# Patient Record
Sex: Female | Born: 1940 | Race: White | Hispanic: No | Marital: Married | Smoking: Former smoker
Health system: Southern US, Community
[De-identification: ages and names within clinical notes are randomized; demographics above are authoritative.]

## PROBLEM LIST (undated history)

## (undated) DIAGNOSIS — K52832 Lymphocytic colitis: Secondary | ICD-10-CM

## (undated) DIAGNOSIS — R112 Nausea with vomiting, unspecified: Secondary | ICD-10-CM

## (undated) DIAGNOSIS — K2960 Other gastritis without bleeding: Secondary | ICD-10-CM

## (undated) DIAGNOSIS — K5790 Diverticulosis of intestine, part unspecified, without perforation or abscess without bleeding: Secondary | ICD-10-CM

## (undated) DIAGNOSIS — K589 Irritable bowel syndrome without diarrhea: Secondary | ICD-10-CM

## (undated) DIAGNOSIS — K219 Gastro-esophageal reflux disease without esophagitis: Secondary | ICD-10-CM

## (undated) DIAGNOSIS — Z9889 Other specified postprocedural states: Secondary | ICD-10-CM

## (undated) DIAGNOSIS — G43909 Migraine, unspecified, not intractable, without status migrainosus: Secondary | ICD-10-CM

## (undated) DIAGNOSIS — N8110 Cystocele, unspecified: Secondary | ICD-10-CM

## (undated) HISTORY — DX: Irritable bowel syndrome without diarrhea: K58.9

## (undated) HISTORY — PX: VAGINAL HYSTERECTOMY: SUR661

## (undated) HISTORY — DX: Diverticulosis of intestine, part unspecified, without perforation or abscess without bleeding: K57.90

## (undated) HISTORY — DX: Migraine, unspecified, not intractable, without status migrainosus: G43.909

## (undated) HISTORY — DX: Gastro-esophageal reflux disease without esophagitis: K21.9

## (undated) HISTORY — DX: Other gastritis without bleeding: K29.60

## (undated) HISTORY — DX: Cystocele, unspecified: N81.10

## (undated) HISTORY — DX: Lymphocytic colitis: K52.832

## (undated) HISTORY — PX: BREAST SURGERY: SHX581

## (undated) HISTORY — PX: CATARACT EXTRACTION: SUR2

---

## 1998-01-28 ENCOUNTER — Ambulatory Visit (HOSPITAL_COMMUNITY): Admission: RE | Admit: 1998-01-28 | Discharge: 1998-01-28 | Payer: Self-pay | Admitting: Obstetrics and Gynecology

## 2000-04-06 ENCOUNTER — Ambulatory Visit (HOSPITAL_COMMUNITY): Admission: RE | Admit: 2000-04-06 | Discharge: 2000-04-06 | Payer: Self-pay | Admitting: Obstetrics and Gynecology

## 2000-04-06 ENCOUNTER — Encounter: Payer: Self-pay | Admitting: Obstetrics and Gynecology

## 2001-04-08 ENCOUNTER — Encounter: Payer: Self-pay | Admitting: Obstetrics and Gynecology

## 2001-04-08 ENCOUNTER — Ambulatory Visit (HOSPITAL_COMMUNITY): Admission: RE | Admit: 2001-04-08 | Discharge: 2001-04-08 | Payer: Self-pay | Admitting: Obstetrics and Gynecology

## 2002-06-14 ENCOUNTER — Ambulatory Visit (HOSPITAL_COMMUNITY): Admission: RE | Admit: 2002-06-14 | Discharge: 2002-06-14 | Payer: Self-pay | Admitting: Obstetrics and Gynecology

## 2002-06-14 ENCOUNTER — Encounter: Payer: Self-pay | Admitting: Obstetrics and Gynecology

## 2004-08-19 ENCOUNTER — Ambulatory Visit (HOSPITAL_COMMUNITY): Admission: RE | Admit: 2004-08-19 | Discharge: 2004-08-19 | Payer: Self-pay | Admitting: Family Medicine

## 2005-02-04 ENCOUNTER — Ambulatory Visit: Payer: Self-pay | Admitting: Family Medicine

## 2005-02-10 ENCOUNTER — Ambulatory Visit: Payer: Self-pay | Admitting: Internal Medicine

## 2005-08-17 ENCOUNTER — Ambulatory Visit (HOSPITAL_COMMUNITY): Admission: RE | Admit: 2005-08-17 | Discharge: 2005-08-17 | Payer: Self-pay | Admitting: Obstetrics and Gynecology

## 2005-08-19 ENCOUNTER — Ambulatory Visit: Payer: Self-pay | Admitting: Family Medicine

## 2005-09-10 ENCOUNTER — Ambulatory Visit: Payer: Self-pay | Admitting: Family Medicine

## 2005-09-22 ENCOUNTER — Ambulatory Visit: Payer: Self-pay | Admitting: Family Medicine

## 2006-01-26 ENCOUNTER — Ambulatory Visit: Payer: Self-pay | Admitting: Family Medicine

## 2006-08-19 ENCOUNTER — Ambulatory Visit: Payer: Self-pay | Admitting: Family Medicine

## 2006-08-19 ENCOUNTER — Ambulatory Visit (HOSPITAL_COMMUNITY): Admission: RE | Admit: 2006-08-19 | Discharge: 2006-08-19 | Payer: Self-pay | Admitting: Obstetrics and Gynecology

## 2006-10-12 HISTORY — PX: SHOULDER SURGERY: SHX246

## 2007-08-22 ENCOUNTER — Ambulatory Visit (HOSPITAL_COMMUNITY): Admission: RE | Admit: 2007-08-22 | Discharge: 2007-08-22 | Payer: Self-pay | Admitting: Obstetrics and Gynecology

## 2007-08-26 ENCOUNTER — Encounter: Admission: RE | Admit: 2007-08-26 | Discharge: 2007-08-26 | Payer: Self-pay | Admitting: Obstetrics and Gynecology

## 2007-09-01 ENCOUNTER — Ambulatory Visit: Payer: Self-pay | Admitting: Family Medicine

## 2007-09-01 DIAGNOSIS — J189 Pneumonia, unspecified organism: Secondary | ICD-10-CM

## 2007-09-05 ENCOUNTER — Encounter: Admission: RE | Admit: 2007-09-05 | Discharge: 2007-09-05 | Payer: Self-pay | Admitting: Obstetrics and Gynecology

## 2007-09-15 ENCOUNTER — Ambulatory Visit: Payer: Self-pay | Admitting: Internal Medicine

## 2007-09-15 DIAGNOSIS — M810 Age-related osteoporosis without current pathological fracture: Secondary | ICD-10-CM | POA: Insufficient documentation

## 2007-09-15 DIAGNOSIS — R32 Unspecified urinary incontinence: Secondary | ICD-10-CM | POA: Insufficient documentation

## 2007-09-22 ENCOUNTER — Ambulatory Visit: Payer: Self-pay | Admitting: Family Medicine

## 2007-09-27 ENCOUNTER — Telehealth (INDEPENDENT_AMBULATORY_CARE_PROVIDER_SITE_OTHER): Payer: Self-pay | Admitting: *Deleted

## 2007-10-13 HISTORY — PX: ESOPHAGOGASTRODUODENOSCOPY (EGD) WITH ESOPHAGEAL DILATION: SHX5812

## 2007-10-13 HISTORY — PX: COLONOSCOPY W/ BIOPSIES: SHX1374

## 2008-03-12 ENCOUNTER — Ambulatory Visit: Payer: Self-pay | Admitting: Family Medicine

## 2008-03-12 DIAGNOSIS — G43009 Migraine without aura, not intractable, without status migrainosus: Secondary | ICD-10-CM | POA: Insufficient documentation

## 2008-03-12 DIAGNOSIS — K589 Irritable bowel syndrome without diarrhea: Secondary | ICD-10-CM

## 2008-04-11 ENCOUNTER — Ambulatory Visit: Payer: Self-pay | Admitting: Internal Medicine

## 2008-04-11 LAB — CONVERTED CEMR LAB
Basophils Relative: 0.4 % (ref 0.0–1.0)
Calcium: 9.3 mg/dL (ref 8.4–10.5)
Eosinophils Absolute: 0.3 10*3/uL (ref 0.0–0.7)
GFR calc Af Amer: 80 mL/min
Glucose, Bld: 84 mg/dL (ref 70–99)
IgA: 219 mg/dL (ref 68–378)
MCHC: 34.2 g/dL (ref 30.0–36.0)
Monocytes Absolute: 0.5 10*3/uL (ref 0.1–1.0)
Monocytes Relative: 5.8 % (ref 3.0–12.0)
Neutro Abs: 6.7 10*3/uL (ref 1.4–7.7)
Neutrophils Relative %: 71.7 % (ref 43.0–77.0)
Platelets: 318 10*3/uL (ref 150–400)
RBC: 4.29 M/uL (ref 3.87–5.11)
Sodium: 140 meq/L (ref 135–145)

## 2008-04-16 LAB — CONVERTED CEMR LAB: Tissue Transglutaminase Ab, IgA: 0.3 units (ref <7)

## 2008-04-17 ENCOUNTER — Ambulatory Visit: Payer: Self-pay | Admitting: Internal Medicine

## 2008-04-17 ENCOUNTER — Encounter (INDEPENDENT_AMBULATORY_CARE_PROVIDER_SITE_OTHER): Payer: Self-pay | Admitting: *Deleted

## 2008-04-17 ENCOUNTER — Encounter: Payer: Self-pay | Admitting: Internal Medicine

## 2008-04-20 ENCOUNTER — Telehealth (INDEPENDENT_AMBULATORY_CARE_PROVIDER_SITE_OTHER): Payer: Self-pay

## 2008-05-11 ENCOUNTER — Encounter (INDEPENDENT_AMBULATORY_CARE_PROVIDER_SITE_OTHER): Payer: Self-pay | Admitting: Internal Medicine

## 2008-06-04 ENCOUNTER — Ambulatory Visit: Payer: Self-pay | Admitting: Internal Medicine

## 2008-06-04 DIAGNOSIS — K5289 Other specified noninfective gastroenteritis and colitis: Secondary | ICD-10-CM

## 2008-06-04 DIAGNOSIS — K219 Gastro-esophageal reflux disease without esophagitis: Secondary | ICD-10-CM | POA: Insufficient documentation

## 2008-06-04 DIAGNOSIS — K222 Esophageal obstruction: Secondary | ICD-10-CM | POA: Insufficient documentation

## 2008-07-05 ENCOUNTER — Ambulatory Visit: Payer: Self-pay | Admitting: Family Medicine

## 2008-07-05 DIAGNOSIS — IMO0002 Reserved for concepts with insufficient information to code with codable children: Secondary | ICD-10-CM

## 2008-07-05 DIAGNOSIS — M76829 Posterior tibial tendinitis, unspecified leg: Secondary | ICD-10-CM | POA: Insufficient documentation

## 2008-07-16 ENCOUNTER — Ambulatory Visit: Payer: Self-pay | Admitting: Family Medicine

## 2008-09-03 ENCOUNTER — Encounter (INDEPENDENT_AMBULATORY_CARE_PROVIDER_SITE_OTHER): Payer: Self-pay | Admitting: Internal Medicine

## 2008-09-04 ENCOUNTER — Ambulatory Visit (HOSPITAL_COMMUNITY): Admission: RE | Admit: 2008-09-04 | Discharge: 2008-09-04 | Payer: Self-pay | Admitting: Family Medicine

## 2008-09-05 ENCOUNTER — Encounter (INDEPENDENT_AMBULATORY_CARE_PROVIDER_SITE_OTHER): Payer: Self-pay | Admitting: *Deleted

## 2008-09-11 ENCOUNTER — Ambulatory Visit: Payer: Self-pay | Admitting: Family Medicine

## 2008-09-11 ENCOUNTER — Other Ambulatory Visit: Admission: RE | Admit: 2008-09-11 | Discharge: 2008-09-11 | Payer: Self-pay | Admitting: Family Medicine

## 2008-09-11 ENCOUNTER — Encounter (INDEPENDENT_AMBULATORY_CARE_PROVIDER_SITE_OTHER): Payer: Self-pay | Admitting: Internal Medicine

## 2008-09-11 DIAGNOSIS — E785 Hyperlipidemia, unspecified: Secondary | ICD-10-CM

## 2008-09-13 ENCOUNTER — Encounter (INDEPENDENT_AMBULATORY_CARE_PROVIDER_SITE_OTHER): Payer: Self-pay | Admitting: *Deleted

## 2008-09-13 LAB — CONVERTED CEMR LAB
BUN: 10 mg/dL (ref 6–23)
Basophils Relative: 0.9 % (ref 0.0–3.0)
Chloride: 105 meq/L (ref 96–112)
Cholesterol: 244 mg/dL (ref 0–200)
Eosinophils Absolute: 0.4 10*3/uL (ref 0.0–0.7)
GFR calc Af Amer: 80 mL/min
GFR calc non Af Amer: 66 mL/min
HCT: 40.1 % (ref 36.0–46.0)
Hemoglobin: 13.5 g/dL (ref 12.0–15.0)
Lymphocytes Relative: 26.5 % (ref 12.0–46.0)
MCV: 94.2 fL (ref 78.0–100.0)
Platelets: 286 10*3/uL (ref 150–400)
Potassium: 4.2 meq/L (ref 3.5–5.1)
RDW: 12.2 % (ref 11.5–14.6)
Sodium: 138 meq/L (ref 135–145)
Total CHOL/HDL Ratio: 3.6
WBC: 6.6 10*3/uL (ref 4.5–10.5)

## 2008-09-17 ENCOUNTER — Encounter (INDEPENDENT_AMBULATORY_CARE_PROVIDER_SITE_OTHER): Payer: Self-pay | Admitting: Internal Medicine

## 2008-10-01 ENCOUNTER — Telehealth: Payer: Self-pay | Admitting: Physician Assistant

## 2008-11-22 ENCOUNTER — Ambulatory Visit: Payer: Self-pay | Admitting: Family Medicine

## 2008-11-22 DIAGNOSIS — H6120 Impacted cerumen, unspecified ear: Secondary | ICD-10-CM

## 2008-12-31 ENCOUNTER — Ambulatory Visit: Payer: Self-pay | Admitting: Family Medicine

## 2008-12-31 DIAGNOSIS — S51809A Unspecified open wound of unspecified forearm, initial encounter: Secondary | ICD-10-CM | POA: Insufficient documentation

## 2009-01-02 ENCOUNTER — Ambulatory Visit: Payer: Self-pay | Admitting: Family Medicine

## 2009-06-27 ENCOUNTER — Telehealth: Payer: Self-pay | Admitting: Internal Medicine

## 2009-06-27 ENCOUNTER — Ambulatory Visit: Payer: Self-pay | Admitting: Gastroenterology

## 2009-07-26 ENCOUNTER — Ambulatory Visit: Payer: Self-pay | Admitting: Internal Medicine

## 2009-09-09 ENCOUNTER — Ambulatory Visit (HOSPITAL_COMMUNITY): Admission: RE | Admit: 2009-09-09 | Discharge: 2009-09-09 | Payer: Self-pay | Admitting: Family Medicine

## 2009-09-11 ENCOUNTER — Encounter (INDEPENDENT_AMBULATORY_CARE_PROVIDER_SITE_OTHER): Payer: Self-pay | Admitting: *Deleted

## 2009-09-13 ENCOUNTER — Telehealth (INDEPENDENT_AMBULATORY_CARE_PROVIDER_SITE_OTHER): Payer: Self-pay | Admitting: Internal Medicine

## 2009-09-30 ENCOUNTER — Telehealth (INDEPENDENT_AMBULATORY_CARE_PROVIDER_SITE_OTHER): Payer: Self-pay | Admitting: Internal Medicine

## 2009-10-02 ENCOUNTER — Ambulatory Visit: Payer: Self-pay | Admitting: Family Medicine

## 2009-10-02 DIAGNOSIS — R519 Headache, unspecified: Secondary | ICD-10-CM | POA: Insufficient documentation

## 2009-10-02 DIAGNOSIS — R51 Headache: Secondary | ICD-10-CM

## 2009-10-03 ENCOUNTER — Encounter (INDEPENDENT_AMBULATORY_CARE_PROVIDER_SITE_OTHER): Payer: Self-pay | Admitting: Internal Medicine

## 2009-10-03 ENCOUNTER — Ambulatory Visit: Payer: Self-pay | Admitting: Family Medicine

## 2009-10-08 ENCOUNTER — Telehealth (INDEPENDENT_AMBULATORY_CARE_PROVIDER_SITE_OTHER): Payer: Self-pay | Admitting: Internal Medicine

## 2009-10-08 LAB — CONVERTED CEMR LAB
ALT: 14 units/L (ref 0–35)
AST: 19 units/L (ref 0–37)
BUN: 10 mg/dL (ref 6–23)
Chloride: 106 meq/L (ref 96–112)
Direct LDL: 150.8 mg/dL
GFR calc non Af Amer: 65.7 mL/min (ref >60)
Glucose, Bld: 87 mg/dL (ref 70–99)
HDL: 73.8 mg/dL (ref >39.00)
TSH: 2.38 microintl units/mL (ref 0.35–5.50)
VLDL: 41 mg/dL — ABNORMAL HIGH (ref 0.0–40.0)

## 2009-10-16 ENCOUNTER — Encounter: Payer: Self-pay | Admitting: Family Medicine

## 2009-10-17 ENCOUNTER — Encounter: Payer: Self-pay | Admitting: Family Medicine

## 2009-10-17 ENCOUNTER — Ambulatory Visit: Payer: Self-pay | Admitting: Internal Medicine

## 2010-03-20 ENCOUNTER — Telehealth: Payer: Self-pay | Admitting: Internal Medicine

## 2010-03-27 ENCOUNTER — Ambulatory Visit: Payer: Self-pay | Admitting: Internal Medicine

## 2010-04-09 ENCOUNTER — Ambulatory Visit: Payer: Self-pay | Admitting: Family Medicine

## 2010-04-10 LAB — CONVERTED CEMR LAB
HDL: 69.2 mg/dL (ref >39.00)
VLDL: 47.2 mg/dL — ABNORMAL HIGH (ref 0.0–40.0)

## 2010-04-23 ENCOUNTER — Ambulatory Visit: Payer: Self-pay | Admitting: Family Medicine

## 2010-04-24 ENCOUNTER — Telehealth: Payer: Self-pay | Admitting: Internal Medicine

## 2010-04-30 ENCOUNTER — Ambulatory Visit: Payer: Self-pay | Admitting: Internal Medicine

## 2010-07-11 ENCOUNTER — Ambulatory Visit: Payer: Self-pay | Admitting: Family Medicine

## 2010-07-14 LAB — CONVERTED CEMR LAB
Cholesterol: 251 mg/dL — ABNORMAL HIGH (ref 0–200)
VLDL: 42 mg/dL — ABNORMAL HIGH (ref 0.0–40.0)

## 2010-07-15 ENCOUNTER — Ambulatory Visit: Payer: Self-pay | Admitting: Family Medicine

## 2010-07-17 ENCOUNTER — Ambulatory Visit: Payer: Self-pay | Admitting: Internal Medicine

## 2010-07-17 ENCOUNTER — Encounter (INDEPENDENT_AMBULATORY_CARE_PROVIDER_SITE_OTHER): Payer: Self-pay | Admitting: *Deleted

## 2010-09-11 ENCOUNTER — Ambulatory Visit (HOSPITAL_COMMUNITY)
Admission: RE | Admit: 2010-09-11 | Discharge: 2010-09-11 | Payer: Self-pay | Source: Home / Self Care | Admitting: Family Medicine

## 2010-11-05 ENCOUNTER — Telehealth: Payer: Self-pay | Admitting: Family Medicine

## 2010-11-06 ENCOUNTER — Telehealth: Payer: Self-pay | Admitting: Family Medicine

## 2010-11-11 ENCOUNTER — Ambulatory Visit
Admission: RE | Admit: 2010-11-11 | Discharge: 2010-11-11 | Payer: Self-pay | Source: Home / Self Care | Attending: Family Medicine | Admitting: Family Medicine

## 2010-11-11 ENCOUNTER — Other Ambulatory Visit: Payer: Self-pay | Admitting: Family Medicine

## 2010-11-11 LAB — LIPID PANEL
HDL: 74.9 mg/dL (ref >39.00)
Triglycerides: 173 mg/dL — ABNORMAL HIGH (ref 0.0–149.0)
VLDL: 34.6 mg/dL (ref 0.0–40.0)

## 2010-11-11 LAB — LDL CHOLESTEROL, DIRECT: Direct LDL: 159.1 mg/dL

## 2010-11-11 NOTE — Consult Note (Signed)
Summary: [facility]  [facility]   Imported By: Lanelle Bal 01/25/2010 09:50:42  _____________________________________________________________________  External Attachment:    Type:   Image     Comment:   External Document

## 2010-11-11 NOTE — Assessment & Plan Note (Signed)
Summary: 3 MO F/U.Robin KitchenMarland KitchenAS.   History of Present Illness Visit Type: Follow-up Visit Primary GI MD: Stan Head MD [facility] Primary Provider: [name] Requesting Provider: n/a Chief Complaint: diarrhea- Tuesday night History of Present Illness:   70 yo ww with lymphocytic colitis off Entocort EC since 8/17. S he kept a log of stool frequency, etc. She has noticed she is "either constipated or having diarrhea". she does not have incontinence. After a spell of diarrhea she feels tired and does not eat much, takes a 1/2 loperamide and no stoll for 2-3 days. she resumes eating normally and stools normalize and then  Frequent relatively bland foods in small amounts helps. # spells of diarrhea since August. Does get constipated also, about 2-3 times since August. Tums helps late afternoon indigestion. eatig twice a day, morning and 3PM. Levsin also helps pain and gas. Still has that and indigestion late in afternoon. No caffeine and no smoking. Source of stress is husband's retirement and "he follows me around and bugs me"   GI Review of Systems    Reports abdominal pain, bloating, and  nausea.     Location of  Abdominal pain: lower abdomen.    Denies acid reflux, belching, chest pain, dysphagia with liquids, dysphagia with solids, heartburn, loss of appetite, vomiting, vomiting blood, weight loss, and  weight gain.      Reports constipation and  diarrhea.     Denies anal fissure, black tarry stools, change in bowel habit, diverticulosis, fecal incontinence, heme positive stool, hemorrhoids, irritable bowel syndrome, jaundice, light color stool, liver problems, rectal bleeding, and  rectal pain.     Current Medications (verified): 1)  Premarin 0.3 Mg  Tabs (Estrogens Conjugated) .... One Tablet Daily 2)  Nasonex 50 Mcg/act  Susp (Mometasone Furoate) .... 2 Sprays Each Nostril Once Daily As Needed 3)  Calcium 1200 1200-1000 Mg-Unit  Chew (Calcium Carbonate-Vit D-Min) .... Chew 1 Tablet  Twice Daily 4)  Zonisamide 100 Mg  Caps (Zonisamide) .... One Tablet Every Day 5)  Zonisamide 25 Mg  Caps (Zonisamide) .... Three  Tablets Every Day 6)  Cvs Migraine Relief 250-250-65 Mg  Tabs (Aspirin-Acetaminophen-Caffeine) .... As Needed 7)  Vitamin D 1000 Unit  Tabs (Cholecalciferol) .Robin Ellis.. 1 By Mouth Once Daily 8)  Omeprazole 20 Mg  Cpdr (Omeprazole) .... One Tablet By Mouth Once Daily 9)  Align  Caps (Misc Intestinal Flora Regulat) .Robin Ellis.. 1 Each Day 10)  Levsin/sl 0.125 Mg  Subl (Hyoscyamine Sulfate) .... One Tablet By Mouth Every 6 Hours As Needed For Abdominal Pain, Bloating 11)  Vitamin C 500 Mg Tabs (Ascorbic Acid) .Robin Ellis.. 1 Tab Daily 12)  Fish Oil 1000 Mg Caps (Omega-3 Fatty Acids) .Robin Ellis.. 1 By Mouth Qd  Allergies: 1)  ! Codeine 2)  ! * Omnicef 3)  ! * Avalox 4)  ! Vicodin  Past History:  Past Medical History: Osteoporosis Urinary incontinence Migraines Lymphocytic colitis GERD and esophageal stricture EMGs both lower legs 09/17/08--neg IBS  Past Surgical History: Reviewed history from 04/11/2008 and no changes required. L shoulder surg--Dr Cleophas Dunker  --1/08 Hysterectomy, no oopherectomy  Family History: Reviewed history from 04/11/2008 and no changes required. No FH of Colon Cancer: Mother: Multiple myeloma Father: 13, no known condition  Social History: Reviewed history from 04/30/2010 and no changes required. Marital Status: Married Children: 3 adult, 1 grand child Occupation: retired from [employer] Patient is a former smoker. 20 years ago Alcohol Use - no Daily Caffeine Use 1 cup per day Illicit Drug  Use - no Patient gets regular exercise.  Review of Systems       insomnia, 4-5 AM gas build-up and bothered so awake   Vital Signs:  Patient profile:   70 year old female Height:      60.25 inches Weight:      119.13 pounds BMI:     23.16 Pulse rate:   72 / minute Pulse rhythm:   regular BP sitting:   136 / 82  (left arm) Cuff size:    regular  Vitals Entered By: June McMurray CMA Duncan Dull) (July 17, 2010 8:28 AM)  Physical Exam  General:  Well developed, well nourished, no acute distress. Abdomen:  soft and non-tender.   Psych:  Alert and cooperative. Normal mood and affect.   Impression & Recommendations:  Problem # 1:  IBS (ICD-564.1) Assessment Improved Better but quality of life not where she wants it. Try bedtime TCA (amitriptylline) rationale and side effects discussed Follow-up as needed, if working go with it but if not well enough after few months return continue as needed hyoscyamine  Problem # 2:  GERD (ICD-530.81) Assessment: Deteriorated She is having afternoon sxs on 20 mg omeprazole chage to 40 mg pantoprazole lifestyle measures ok  Problem # 3:  LYMPHOCYTIC COLITIS (ICD-558.9) Assessment: Improved does not sound active  Patient Instructions: 1)  Please pick up your medications at your pharmacy. 2)  omeprazole changes to pantoprazole 3)  amitryptyliine started for IBS - will need to have pharmacy call for a refill after 3 months if not returning to Dr. Leone Payor by then 4)  If these changes are working then continue the medications. 5)  If after 2-3 months your quality of life with IBS is not where you desire due to symptoms, return to see Dr. Leone Payor. 6)  IBS brochure given.  7)  The medication list was reviewed and reconciled.  All changed / newly prescribed medications were explained.  A complete medication list was provided to the patient / caregiver. Prescriptions: AMITRIPTYLINE HCL 25 MG TABS (AMITRIPTYLINE HCL) 1/2 tab nightly first week (at bedtime) then 1 tab at bedtime  #30 x 2   Entered and Authorized by:   Iva Boop MD, [facility]   Signed by:   Iva Boop MD, FACG on 07/17/2010   Method used:   [facility]  [address] [id number]       [address]       [place], [place]  [address]       Ph: [phone] or [phone]       Fax: [phone]    RxID:   [id number] PANTOPRAZOLE SODIUM 40 MG TBEC (PANTOPRAZOLE SODIUM) 1 by mouth once daily 30-60 minutes before breakfast  #30 x 11   Entered and Authorized by:   Iva Boop MD, [facility]   Signed by:   Iva Boop MD, [facility] on 07/17/2010   Method used:   [facility]  [address] ([facility]       [address]       [place], [place]  [address]       Ph: [phone] or [phone]       Fax: [phone]   RxID:   [id number]

## 2010-11-11 NOTE — Progress Notes (Signed)
Summary: Unsure if she needs to keep taking Enticort  Phone Note Call from Patient Call back at [facility] Phone [phone]   Call For: Dr Leone Payor Summary of Call: Entocort does she need to refill before her follow up appoinment next wed? When she saw PA she didnt specify one way or the other. Initial call taken by: Leanor Kail [facility] [place],  April 24, 2010 10:17 AM  Follow-up for Phone Call        Advised pt to fill entocort.  We will see her next Wednesday.  Pt is agreeable. Follow-up by: Francee Piccolo CMA Duncan Dull),  April 24, 2010 10:36 AM

## 2010-11-11 NOTE — Assessment & Plan Note (Signed)
Summary: nausea/diarrhea/muscus in stool/Robin Ellis   History of Present Illness Visit Type: Follow-up Visit Primary GI MD: Stan Head MD [facility] Primary Provider: [name] Requesting Provider: [name] Chief Complaint: nausea and diarrhea History of Present Illness:   Robin Ellis 70 YO FEMALE KNOWN TO DR. Leone Payor WITH DX OF LYPHOCYTIC COLITIS. SHE HAS BEEN TREATED WITH ENTOCORT IN THE PAST-NO RECENT MAINTAINANCE MED. SHE RELATES ONSET OF CURRENT  SXS ABOUT 3 WEEKS AGO WITH ABDOMINAL CRAMPING/NAUSEA,THEN LOOSE STOOLS DURING THE NIGHT. SHE FELT BAD ALL THE NEXT DAY.APPETITE DECREASED. SHE STATED FEELING BETTER THEN STARTED AGAIN WITH BLOATING ,"ROLLING" IN ABDOMEN,THEN ALOT OF WATERY STOOLS. SHE PUT HERSELF ON LIQUIDS FOR A FEW DAYS,FELT BETTER ,THEN AGAIN NOTED NAUSEA, LOW BACK ACHE AND LOOSE STOOLS. NO MELENA OR HEME. NO FEVER/CHILLS. NO RECENT ANTIBIOTICS. SHE HAS TAKEN SOME IMMODIUM    GI Review of Systems    Reports abdominal pain, bloating, nausea, and  weight loss.     Location of  Abdominal pain: generalized. Weight loss of FEW pounds   Denies acid reflux, belching, chest pain, dysphagia with liquids, dysphagia with solids, heartburn, loss of appetite, vomiting, and  vomiting blood.      Reports change in bowel habits and  diarrhea.     Denies anal fissure, black tarry stools, constipation, diverticulosis, fecal incontinence, heme positive stool, hemorrhoids, irritable bowel syndrome, jaundice, light color stool, liver problems, rectal bleeding, and  rectal pain.    Current Medications (verified): 1)  Premarin 0.3 Mg  Tabs (Estrogens Conjugated) .... One Tablet Daily 2)  Nasonex 50 Mcg/act  Susp (Mometasone Furoate) .... 2 Sprays Each Nostril Once Daily As Needed 3)  Calcium 1200 1200-1000 Mg-Unit  Chew (Calcium Carbonate-Vit D-Min) .... Chew 1 Tablet Twice Daily 4)  Zonisamide 100 Mg  Caps (Zonisamide) .... One Tablet Every Day 5)  Zonisamide 25 Mg  Caps (Zonisamide) ....  Three  Tablets Every Day 6)  Cvs Migraine Relief 250-250-65 Mg  Tabs (Aspirin-Acetaminophen-Caffeine) .... As Needed 7)  Vitamin D 1000 Unit  Tabs (Cholecalciferol) .Marland Kitchen.. 1 By Mouth Once Daily 8)  Omeprazole 20 Mg  Cpdr (Omeprazole) .Marland Kitchen.. 1 Each Day 30 Minutes Before Meal 9)  Align  Caps (Misc Intestinal Flora Regulat) .Marland Kitchen.. 1 Each Day 10)  Levsin/sl 0.125 Mg  Subl (Hyoscyamine Sulfate) .... Take 1-2 Tabs Every 4-6 Hours As Needed For Abdominal Pain. 11)  Vitamin C 500 Mg Tabs (Ascorbic Acid) .Marland Kitchen.. 1 Tab Daily 12)  Flagyl 250 Mg Tabs (Metronidazole) .... Take 1 Tab 3 Times Daily X 10 Days (On Hold If Needed)  Allergies (verified): 1)  ! Codeine 2)  ! * Omnicef 3)  ! * Avalox  Past History:  Past Medical History: Osteoporosis Urinary incontinence Migraines Lymphocytic colitis GERD and esophageal stricture EMGs both lower legs 09/17/08--neg  Past Surgical History: Reviewed history from 04/11/2008 and no changes required. L shoulder surg--Dr Cleophas Dunker  --1/08 Hysterectomy, no oopherectomy  Family History: Reviewed history from 04/11/2008 and no changes required. No FH of Colon Cancer: Mother: Multiple myeloma Father: 71, no known condition  Social History: Reviewed history from 09/11/2008 and no changes required. Marital Status: Married Children: 3 adult, 1 grand child Occupation: retired from [employer] Patient is a former smoker. 20 years ago Alcohol Use - no Daily Caffeine Use 1 cup per day Illicit Drug Use - no Patient gets regular exercise.  Review of Systems       The patient complains of back pain.  The patient denies allergy/sinus, anemia, anxiety-new, arthritis/joint  pain, blood in urine, breast changes/lumps, change in vision, confusion, cough, coughing up blood, depression-new, fainting, fatigue, fever, headaches-new, hearing problems, heart murmur, heart rhythm changes, itching, muscle pains/cramps, night sweats, nosebleeds, shortness of breath, skin rash,  sleeping problems, sore throat, swelling of feet/legs, swollen lymph glands, thirst - excessive, urination - excessive, urination changes/pain, urine leakage, vision changes, and voice change.         ROS OTHERWISE AS IN HPI  Vital Signs:  Patient profile:   70 year old female Height:      60.25 inches Weight:      120.50 pounds BMI:     23.42 Pulse rate:   80 / minute Pulse rhythm:   regular BP sitting:   110 / 76  (left arm)  Vitals Entered By: Milford Cage NCMA (March 27, 2010 8:40 AM)  Physical Exam  General:  Well developed, well nourished, no acute distress. Head:  Normocephalic and atraumatic. Eyes:  PERRLA, no icterus. Lungs:  Clear throughout to auscultation. Heart:  Regular rate and rhythm; no murmurs, rubs,  or bruits. Abdomen:  SOFT, MILD TENDERNESS ACROSS LOWER ABDOMEN, NO GUARDING, NO MASS OR HSM,BS+ Rectal:  NOT DONE Extremities:  No clubbing, cyanosis, edema or deformities noted. Neurologic:  Alert and  oriented x4;  grossly normal neurologically. Psych:  Alert and cooperative. Normal mood and affect.   Impression & Recommendations:  Problem # 1:  LYMPHOCYTIC COLITIS (ICD-558.9) Assessment Deteriorated 70 YO FEMALE WITH HX OF LYMPHOCYTIC COLITIS ,LAST TREATED 2009. NOW WITH DIARRHEA,ABDOMINAL CRAMPING AND NAUSEA X 3 WEEKS CONSISTENT WITH EXACERBATION.   LABS AS BELOW.  CONTINUE LEVSIN-ENCOURAGED HER TO USE IT MORE REGULARLY FOR CRAMPING RESART ENTOCORT EC 3 MG, 3 DAILY INCREASE PRILOSEC TO TWICE DAILY FOR TWO WEEKS FOLLOW UP WITH DR. Leone Payor IN ONE MONTH.  Problem # 2:  DIVERTICULOSIS-COLON (ICD-562.10) Assessment: Comment Only  Problem # 3:  GERD (ICD-530.81) Assessment: Comment Only SEE ABOVE  Patient Instructions: 1)  Increase Prilosec one tablet by mouth two times a day x 2 weeks then reduce to one tablet by mouth once daily. 2)  Entocort, and Levsin has been sent to your pharmacy.  3)  Follow up with Dr. Leone Payor in one month on 04/30/10 at  9:45am. 4)  Copy sent to : Laurita Quint, MD 5)  The medication list was reviewed and reconciled.  All changed / newly prescribed medications were explained.  A complete medication list was provided to the patient / caregiver. Prescriptions: LEVSIN/SL 0.125 MG  SUBL (HYOSCYAMINE SULFATE) one tablet by mouth every 6 hours as needed for abdominal pain, bloating  #60 x 2   Entered by:   Christie Nottingham CMA (AAMA)   Authorized by:   Sammuel Cooper PA-c   Signed by:   Christie Nottingham CMA (AAMA) on 03/27/2010   Method used:   [facility]        [facility]  [address] [facility]       [address]       [place], [place]  [address]       Ph: [phone] or [phone]       Fax: [phone]   RxID:   [id number] ENTOCORT EC 3 MG XR24H-CAP (BUDESONIDE) 3 capsules by mouth every morning  #90 x 2   Entered by:   Christie Nottingham CMA (AAMA)   Authorized by:   Sammuel Cooper PA-c   Signed by:   Christie Nottingham CMA (AAMA) on 03/27/2010   Method used:   [facility]  [facility]        [facility]  [address] [facility]ail)       [address]       [place], [place]  [address]       Ph: [phone] or [phone]       Fax: [phone]   RxID:   [id number]

## 2010-11-11 NOTE — Assessment & Plan Note (Signed)
Summary: f/u colitis/all   History of Present Illness Primary GI MD: Stan Head MD [facility] Primary Provider: [name] Requesting Provider: n/a Chief Complaint: f/u lymphocytic colitis. Pt states she had several episodes 2 weeks ago wih nausea, cramping and loose stools but not any this week. Pt states she still has a lot of built of gas, bleching and also mucus yellow in stools. History of Present Illness:   70 yo woman followed with lymphocytic colitis. Has seen the extenders twice in last few months.Better overall  She had been having some spells off diarrhea and abdominal pain and on for months at least.  Has to use the [facility] most days of the month. She ? f that is triggering problems.  5AM gas pains, walks, expels gas and then defecates. She has reduced fiber. She has tried to alter eating also. She has alot of belching also. She has eliminated products without help.    GI Review of Systems    Reports belching.      Denies abdominal pain, acid reflux, chest pain, dysphagia with liquids, dysphagia with solids, heartburn, loss of appetite, nausea, vomiting, vomiting blood, weight loss, and  weight gain.        Denies anal fissure, black tarry stools, change in bowel habit, constipation, diarrhea, diverticulosis, fecal incontinence, heme positive stool, hemorrhoids, irritable bowel syndrome, jaundice, light color stool, liver problems, rectal bleeding, and  rectal pain.    Current Medications (verified): 1)  Premarin 0.3 Mg  Tabs (Estrogens Conjugated) .... One Tablet Daily 2)  Nasonex 50 Mcg/act  Susp (Mometasone Furoate) .... 2 Sprays Each Nostril Once Daily As Needed 3)  Calcium 1200 1200-1000 Mg-Unit  Chew (Calcium Carbonate-Vit D-Min) .... Chew 1 Tablet Twice Daily 4)  Zonisamide 100 Mg  Caps (Zonisamide) .... One Tablet Every Day 5)  Zonisamide 25 Mg  Caps (Zonisamide) .... Three  Tablets Every Day 6)  Cvs Migraine Relief 250-250-65 Mg  Tabs  (Aspirin-Acetaminophen-Caffeine) .... As Needed 7)  Vitamin D 1000 Unit  Tabs (Cholecalciferol) .Marland Kitchen.. 1 By Mouth Once Daily 8)  Omeprazole 20 Mg  Cpdr (Omeprazole) .... One Tablet By Mouth Once Daily 9)  Align  Caps (Misc Intestinal Flora Regulat) .Marland Kitchen.. 1 Each Day 10)  Levsin/sl 0.125 Mg  Subl (Hyoscyamine Sulfate) .... One Tablet By Mouth Every 6 Hours As Needed For Abdominal Pain, Bloating 11)  Vitamin C 500 Mg Tabs (Ascorbic Acid) .Marland Kitchen.. 1 Tab Daily 12)  Entocort Ec 3 Mg Xr24h-Cap (Budesonide) .... 3 Capsules By Mouth Every Morning 13)  Fish Oil 1000 Mg Caps (Omega-3 Fatty Acids) .Marland Kitchen.. 1 By Mouth Qd 14)  Flagyl 250 Mg Tabs (Metronidazole) .Marland Kitchen.. 1 By Mouth Three Times A Day X 10 Days  Allergies (verified): 1)  ! Codeine 2)  ! * Omnicef 3)  ! * Avalox  Past History:  Past Medical History: Reviewed history from 03/27/2010 and no changes required. Osteoporosis Urinary incontinence Migraines Lymphocytic colitis GERD and esophageal stricture EMGs both lower legs 09/17/08--neg  Past Surgical History: Reviewed history from 04/11/2008 and no changes required. L shoulder surg--Dr Cleophas Dunker  --1/08 Hysterectomy, no oopherectomy  Family History: Reviewed history from 04/11/2008 and no changes required. No FH of Colon Cancer: Mother: Multiple myeloma Father: 55, no known condition  Social History: Marital Status: Married Children: 3 adult, 1 grand child Occupation: retired from [employer] Patient is a former smoker. 20 years ago Alcohol Use - no Daily Caffeine Use 1 cup per day Illicit Drug Use - no Patient  gets regular exercise.  Vital Signs:  Patient profile:   70 year old female Height:      60.25 inches Weight:      118.25 pounds BMI:     22.99 Pulse rate:   70 / minute Pulse rhythm:   regular BP sitting:   134 / 82  (right arm) Cuff size:   regular  Vitals Entered By: Christie Nottingham CMA Duncan Dull) (April 30, 2010 9:48 AM)  Physical Exam  General:  Well developed,  well nourished, no acute distress.   Impression & Recommendations:  Problem # 1:  LYMPHOCYTIC COLITIS (ICD-558.9) Assessment Deteriorated She seesm to be improving on Entocort now. will ttreat for 2 months then stop, monitor symptoms. i told her IBS could be issue also and treatment differnt. F/u 3 mos  Problem # 2:  FLATULENCE-GAS-BLOATING (ICD-787.3) Assessment: Deteriorated ? if she has small bowel bacterial overgrowth will treat with metronidazole that she has on hand and see if that helps  Problem # 3:  HEADACHE (ICD-784.0) Assessment: Unchanged chronic issue which requires [facility] which contains salicylates. She is aware this may trigger lymphocytic colitis flares. She has been told by headache center that she may get rebound headaches but says she can skip some days and uses 1x in a day and does not think she is getting rebound headaches.  Patient Instructions: 1)  Take the Flagyl (metronidazole) prescription you have. This may help your gas. 2)  Continue Entocort EC at 9 mg/day for 2 months total. 3)  After you stop the Entocort EC keep a symptom diary with dates, etc. Gas, diarhea, abdominal pain should be reported. 4)  Read and follow the low-residue diet handout. 5)  Please schedule a follow-up appointment in 3 months. 6)  The medication list was reviewed and reconciled.  All changed / newly prescribed medications were explained.  A complete medication list was provided to the patient / caregiver. Prescriptions: FLAGYL 250 MG TABS (METRONIDAZOLE) 1 by mouth three times a day x 10 days  #30 x 0   Entered and Authorized by:   Iva Boop MD, [facility]   Signed by:   Iva Boop MD, FACG on 04/30/2010   Method used:   Historical   RxID:   [id number]

## 2010-11-11 NOTE — Assessment & Plan Note (Signed)
Summary: FLU SHOT/CLE  Nurse Visit   Allergies: 1)  ! Codeine 2)  ! * Omnicef 3)  ! * Avalox  Orders Added: 1)  Flu Vaccine 21yrs + MEDICARE PATIENTS [Q2039] 2)  Administration Flu vaccine - MCR [G0008]  Flu Vaccine Consent Questions     Do you have a history of severe allergic reactions to this vaccine? no    Any prior history of allergic reactions to egg and/or gelatin? no    Do you have a sensitivity to the preservative Thimersol? no    Do you have a past history of Guillan-Barre Syndrome? no    Do you currently have an acute febrile illness? no    Have you ever had a severe reaction to latex? no    Vaccine information given and explained to patient? yes    Are you currently pregnant? no    Lot Number:[id number]   Exp Date:04/11/2011   Site Given  Left Deltoid IMu

## 2010-11-11 NOTE — Letter (Signed)
Summary: Office Visit Letter  [facility]  [address] [place], [place] [address]   Phone: [phone]  Fax: [phone]      July 17, 2010 MRN: [id number]   [facility] [address] [place], [place]  [address]   Dear Ms. Greenley,   According to our records, it is time for you to schedule a follow-up office visit with [place].   At your convenience, please call [phone] (option #2)to schedule an office visit. If you have any questions, concerns, or feel that this letter is in error, we would appreciate your call.   Sincerely,   Iva Boop, M.D.  [facility] [phone]

## 2010-11-11 NOTE — Miscellaneous (Signed)
Summary: BONE DENSITY  Clinical Lists Changes  Orders: Added new Test order of T-Bone Densitometry (77080) - Signed Added new Test order of T-Lumbar Vertebral Assessment (77082) - Signed 

## 2010-11-11 NOTE — Assessment & Plan Note (Signed)
Summary: NEW PATIENT- TRANSFER FROM Robin Ellis   Vital Signs:  Patient profile:   70 year old female Height:      60.25 inches Weight:      119.13 pounds BMI:     23.16 Temp:     98.4 degrees F oral Pulse rate:   64 / minute Pulse rhythm:   regular BP sitting:   110 / 72  (right arm) Cuff size:   regular  Vitals Entered By: Robin Ellis CMA Robin Ellis) (April 23, 2010 9:10 AM) CC: new patient, transfer from Robin Ellis   History of Present Illness: 70 yo new to me here to establish care.  1.  Lymphocytic colitis- followed by Dr. Leone Ellis.  Sees him again next week.  Still having loose, yellowish/gray stools with mucous.  She is very concerned that this has been going on since May and not feeling better.  Does not truly have abdominal pain most of the time, but often has cramping and gas pressure.  No fevers, chills, or bloody stools.  2.  Migraines- [facility].  Has been on Topamax, Imitrex, even botox injections.  Only medication that seems to help is Excedrin Migraine but was told to stop taking it due to her colitis.  Having 1-2 migraines per week, unilateral, assocaited with nausea and photophobia.  3. HLD-TG still elevated but HDL improved (see [facility] form). Was on fish oil but stopped taking it because of her colitis.  4.  post menopausal symptoms- has been on HRT for years.  Try to wean herself off of Premarin but symptoms were intolerable.  Current Medications (verified): 1)  Premarin 0.3 Mg  Tabs (Estrogens Conjugated) .... One Tablet Daily 2)  Nasonex 50 Mcg/act  Susp (Mometasone Furoate) .... 2 Sprays Each Nostril Once Daily As Needed 3)  Calcium 1200 1200-1000 Mg-Unit  Chew (Calcium Carbonate-Vit D-Min) .... Chew 1 Tablet Twice Daily 4)  Zonisamide 100 Mg  Caps (Zonisamide) .... One Tablet Every Day 5)  Zonisamide 25 Mg  Caps (Zonisamide) .... Three  Tablets Every Day 6)  Cvs Migraine Relief 250-250-65 Mg  Tabs (Aspirin-Acetaminophen-Caffeine) .... As  Needed 7)  Vitamin D 1000 Unit  Tabs (Cholecalciferol) .Marland Kitchen.. 1 By Mouth Once Daily 8)  Omeprazole 20 Mg  Cpdr (Omeprazole) .Marland Kitchen.. 1 Tablet By Mouth Two Times A Day X 2 Weeks Reduce To One Tablet By Mouth Once Daily 9)  Align  Caps (Misc Intestinal Flora Regulat) .Marland Kitchen.. 1 Each Day 10)  Levsin/sl 0.125 Mg  Subl (Hyoscyamine Sulfate) .... One Tablet By Mouth Every 6 Hours As Needed For Abdominal Pain, Bloating 11)  Vitamin C 500 Mg Tabs (Ascorbic Acid) .Marland Kitchen.. 1 Tab Daily 12)  Flagyl 250 Mg Tabs (Metronidazole) .... Take 1 Tab 3 Times Daily X 10 Days (On Hold If Needed) 13)  Entocort Ec 3 Mg Xr24h-Cap (Budesonide) .... 3 Capsules By Mouth Every Morning  Allergies: 1)  ! Codeine 2)  ! * Omnicef 3)  ! * Avalox  Past History:  Past Medical History: Last updated: 03/27/2010 Osteoporosis Urinary incontinence Migraines Lymphocytic colitis GERD and esophageal stricture EMGs both lower legs 09/17/08--neg  Past Surgical History: Last updated: 04/11/2008 L shoulder surg--Dr Robin Ellis  --1/08 Hysterectomy, no oopherectomy  Family History: Last updated: 04/11/2008 No FH of Colon Cancer: Mother: Multiple myeloma Father: 43, no known condition  Social History: Last updated: 09/11/2008 Marital Status: Married Children: 3 adult, 1 grand child Occupation: retired from [employer] Patient is a former smoker. 20 years  ago Alcohol Use - no Daily Caffeine Use 1 cup per day Illicit Drug Use - no Patient gets regular exercise.  Risk Factors: Caffeine Use: 0 (03/12/2008) Exercise: yes (04/11/2008)  Risk Factors: Smoking Status: quit (04/11/2008) Passive Smoke Exposure: no (03/12/2008)  Review of Systems      See HPI General:  Denies fatigue. Eyes:  Denies blurring. ENT:  Denies difficulty swallowing. CV:  Denies chest pain or discomfort. Resp:  Denies shortness of breath. GI:  Denies bloody stools, nausea, and vomiting. GU:  Denies abnormal vaginal bleeding. MS:  Denies joint pain,  joint redness, and joint swelling. Derm:  Denies rash. Neuro:  Complains of headaches; denies falling down. Psych:  Denies anxiety and easily angered. Endo:  Denies excessive hunger and heat intolerance.  Physical Exam  General:  alert, well-developed, well-nourished, and well-hydrated.   Head:  Normocephalic and atraumatic without obvious abnormalities. No apparent alopecia or balding. Eyes:  PERRLA, Conjunctiva clear bilaterally.  Ears:  External ear exam shows no significant lesions or deformities.  Otoscopic examination reveals clear canals, tympanic membranes are intact bilaterally without bulging, retraction, inflammation or discharge. Hearing is grossly normal bilaterally. Nose:  mucosal erythema, mucosal edema, and airflow obstruction--mouth breathing.   Mouth:  Oral mucosa and oropharynx without lesions or exudates.  Teeth in good repair. Lungs:  Normal respiratory effort, chest expands symmetrically. Lungs are clear to auscultation, no crackles or wheezes. Heart:  Normal rate and regular rhythm. S1 and S2 normal without gallop, murmur, click, rub or other extra sounds. Abdomen:  soft and non-tender.   Msk:  normal ROM, no joint tenderness, and no joint swelling.   Extremities:  no edema Neurologic:  alert & oriented X3 and gait normal.   Psych:  Cognition and judgment appear intact. Alert and cooperative with normal attention span and concentration. No apparent delusions, illusions, hallucinations   Impression & Recommendations:  Problem # 1:  HYPERLIPIDEMIA (ICD-272.4) Assessment Deteriorated Discussed restarting fish oil and continuing lifestyle modification. Time spent with patient 45 minutes, more than 50% of this time was spent counseling patient on elevated cholesterol, colitis,HRT, and  migraines.  Problem # 2:  HEADACHE (ICD-784.0) Assessment: Deteriorated Discussed options with Robin Ellis.   [facility] non narcotic [facility], so I cannot give her  narcotics.  I did recommend trigger point injections and keeping a headache calendar.  She will think about it. Her updated medication list for this problem includes:    Cvs Migraine Relief 250-250-65 Mg Tabs (Aspirin-acetaminophen-caffeine) .Marland Kitchen... As needed  Problem # 3:  LYMPHOCYTIC COLITIS (ICD-558.9) Assessment: Unchanged Pt has a lot of anxiety concerning this issue. I discussed what I knew about this condition but advised that she discuss in more detail with Dr. Leone Ellis. Her updated medication list for this problem includes:    Align Caps (Misc intestinal flora regulat) .Marland Kitchen... 1 each day  Problem # 4:  Preventive Health Care (ICD-V70.0) Assessment: Comment Only Discussed Zostavax.  She will cal linsurance company to see if they cover. UTD on all other prevention.  Problem # 5:  HRT (ICD-V07.4) Assessment: Unchanged Discussed risks and benefits. She is low risk and wants to continue taking premarin but will try taking it every other day.  Complete Medication List: 1)  Premarin 0.3 Mg Tabs (Estrogens conjugated) .... One tablet daily 2)  Nasonex 50 Mcg/act Susp (Mometasone furoate) .... 2 sprays each nostril once daily as needed 3)  Calcium 1200 1200-1000 Mg-unit Chew (Calcium carbonate-vit d-min) .... Chew 1  tablet twice daily 4)  Zonisamide 100 Mg Caps (Zonisamide) .... One tablet every day 5)  Zonisamide 25 Mg Caps (Zonisamide) .... Three  tablets every day 6)  Cvs Migraine Relief 250-250-65 Mg Tabs (Aspirin-acetaminophen-caffeine) .... As needed 7)  Vitamin D 1000 Unit Tabs (Cholecalciferol) .Marland Kitchen.. 1 by mouth once daily 8)  Omeprazole 20 Mg Cpdr (Omeprazole) .Marland Kitchen.. 1 tablet by mouth two times a day x 2 weeks reduce to one tablet by mouth once daily 9)  Align Caps (Misc intestinal flora regulat) .Marland Kitchen.. 1 each day 10)  Levsin/sl 0.125 Mg Subl (Hyoscyamine sulfate) .... One tablet by mouth every 6 hours as needed for abdominal pain, bloating 11)  Vitamin C 500 Mg Tabs (Ascorbic acid)  .Marland Kitchen.. 1 tab daily 12)  Flagyl 250 Mg Tabs (Metronidazole) .... Take 1 tab 3 times daily x 10 days (on hold if needed) 13)  Entocort Ec 3 Mg Xr24h-cap (Budesonide) .... 3 capsules by mouth every morning  Current Allergies (reviewed today): ! CODEINE ! * OMNICEF ! * AVALOX Last PAP:  NEGATIVE FOR INTRAEPITHELIAL LESIONS OR MALIGNANCY. (09/11/2008 12:00:00 AM) PAP Next Due:  Not Indicated Last Mammogram:  ASSESSMENT: Negative - BI-RADS 1^MM DIGITAL SCREENING (09/09/2009 2:23:00 PM) Mammogram Next Due:  1 yr     Prevention & Chronic Care Immunizations   Influenza vaccine: Fluvax 3+  (07/26/2009)   Influenza vaccine deferral: Not indicated  (04/23/2010)   Influenza vaccine due: 07/26/2010    Tetanus booster: 12/31/2008: Td   Tetanus booster due: 01/01/2019    Pneumococcal vaccine: Pneumovax (Medicare)  (10/02/2009)   Pneumococcal vaccine due: None    H. zoster vaccine: Not documented   H. zoster vaccine deferral: Deferred  (04/23/2010)  Colorectal Screening   Hemoccult: Not documented   Hemoccult action/deferral: Not indicated  (04/23/2010)    Colonoscopy: abnormal  (04/17/2008)   Colonoscopy due: 04/2018  Other Screening   Pap smear: NEGATIVE FOR INTRAEPITHELIAL LESIONS OR MALIGNANCY.  (09/11/2008)   Pap smear due: Not Indicated    Mammogram: ASSESSMENT: Negative - BI-RADS 1^MM DIGITAL SCREENING  (09/09/2009)   Mammogram due: 09/09/2010    DXA bone density scan: Not documented   Smoking status: quit  (04/11/2008)  Lipids   Total Cholesterol: 235  (04/09/2010)   Lipid panel action/deferral: Not indicated   LDL: DEL  (09/11/2008)   LDL Direct: 131.5  (04/09/2010)   HDL: 69.20  (04/09/2010)   Triglycerides: 236.0  (04/09/2010)    SGOT (AST): 19  (10/03/2009)   BMP action: Not indicated   SGPT (ALT): 14  (10/03/2009)   Alkaline phosphatase: 52  (04/11/2008)   Total bilirubin: 0.6  (04/11/2008)    Lipid flowsheet reviewed?: Yes   Progress toward LDL goal:  Unchanged  Self-Management Support :    Lipid self-management support: Not documented

## 2010-11-11 NOTE — Progress Notes (Signed)
Summary: triage  Phone Note Call from Patient Call back at Home Phone [phone]   Caller: Patient Call For: Dr. Leone Payor Reason for Call: Talk to Nurse Summary of Call: doesnt want to wait until next available in July and would like to be seen next week... ok to see Amy or Gunnar Fusi, has seen [facility] before... nausea, diarrhea, bloating Initial call taken by: Vallarie Mare,  March 20, 2010 10:42 AM  Follow-up for Phone Call        patient c/o 2 week hx of nausea and worsening diarrhea.  Some mucus in the stool, denies rectal bleeding.  Patient was offered an appointment for Dr Leone Payor for tomorrow.  She declines she states she is out of town and won't return until next Wed.  Patient  will come in and see Mike Gip PA 03/27/10 8:30 Follow-up by: Darcey Nora RN, CGRN,  March 20, 2010 11:42 AM

## 2010-11-13 NOTE — Progress Notes (Addendum)
Summary: Denial for Premarin  Phone Note Other Incoming Call back at [phone]   Caller: [employer] [place] Summary of Call: Rep with [employer] [place] called to let Dr. Dayton Martes know that Premarin 0.3 mg have been denied because no proof of a lower tier medication was tried.  Will mail denial letter to our office and to the patient.   Initial call taken by: Linde Gillis CMA Duncan Dull),  November 06, 2010 11:12 AM  Follow-up for Phone Call        ok. Ruthe Mannan MD  November 06, 2010 11:37 AM      Appended Document: Denial for Premarin Patient would like to try something similar to Premarin.  She says that Premarin was denied and she would like to try Estradiol if you think this is along the same lines or appropriate for her.  Please advise.  Appended Document: Denial for Premarin estradiol rx sent.  Appended Document: Denial for Premarin Patient notified via telephone.

## 2010-11-13 NOTE — Progress Notes (Signed)
Summary: prior Berkley Harvey is needed for premarin  Phone Note Call from Patient Call back at Home Phone [phone]   Caller: Patient/ [employer] Summary of Call: Pt states that a prior auth is needed for her to get her premarin at a lower price.  She called [employer] and asked them to fax a prior auth form, which is on your desk.  Her insurance has raised premarin to a higher tier, so she needs some explanation as to why she need it documented on the form. Initial call taken by: Lowella Petties CMA, AAMA,  November 05, 2010 11:06 AM  Follow-up for Phone Call        in my box. Ruthe Mannan MD  November 05, 2010 11:39 AM  Form faxed.                Lowella Petties CMA, AAMA  November 05, 2010 12:15 PM

## 2011-01-09 ENCOUNTER — Other Ambulatory Visit: Payer: Self-pay | Admitting: Family Medicine

## 2011-01-12 ENCOUNTER — Other Ambulatory Visit: Payer: Self-pay | Admitting: *Deleted

## 2011-01-12 ENCOUNTER — Other Ambulatory Visit: Payer: Self-pay | Admitting: Family Medicine

## 2011-01-12 DIAGNOSIS — Z0000 Encounter for general adult medical examination without abnormal findings: Secondary | ICD-10-CM

## 2011-01-12 DIAGNOSIS — E785 Hyperlipidemia, unspecified: Secondary | ICD-10-CM

## 2011-01-12 MED ORDER — SIMVASTATIN 10 MG PO TABS: 10.0000 mg | ORAL_TABLET | Freq: Every day | ORAL | Status: DC

## 2011-01-13 ENCOUNTER — Other Ambulatory Visit (INDEPENDENT_AMBULATORY_CARE_PROVIDER_SITE_OTHER): Payer: MEDICARE | Admitting: Family Medicine

## 2011-01-13 DIAGNOSIS — E785 Hyperlipidemia, unspecified: Secondary | ICD-10-CM

## 2011-01-13 DIAGNOSIS — Z0000 Encounter for general adult medical examination without abnormal findings: Secondary | ICD-10-CM

## 2011-01-14 LAB — LIPID PANEL
Cholesterol: 190 mg/dL (ref 0–200)
HDL: 70.2 mg/dL (ref >39.00)
VLDL: 38.6 mg/dL (ref 0.0–40.0)

## 2011-01-14 LAB — BASIC METABOLIC PANEL
BUN: 13 mg/dL (ref 6–23)
CO2: 26 mEq/L (ref 19–32)
Glucose, Bld: 79 mg/dL (ref 70–99)
Potassium: 4.5 mEq/L (ref 3.5–5.1)

## 2011-01-15 ENCOUNTER — Encounter: Payer: Self-pay | Admitting: Family Medicine

## 2011-01-15 LAB — HM COLONOSCOPY

## 2011-01-15 LAB — HM PAP SMEAR

## 2011-01-19 ENCOUNTER — Ambulatory Visit (INDEPENDENT_AMBULATORY_CARE_PROVIDER_SITE_OTHER): Payer: MEDICARE | Admitting: Family Medicine

## 2011-01-19 ENCOUNTER — Encounter: Payer: Self-pay | Admitting: Family Medicine

## 2011-01-19 DIAGNOSIS — E785 Hyperlipidemia, unspecified: Secondary | ICD-10-CM

## 2011-01-19 DIAGNOSIS — R002 Palpitations: Secondary | ICD-10-CM

## 2011-01-19 DIAGNOSIS — R32 Unspecified urinary incontinence: Secondary | ICD-10-CM

## 2011-01-19 LAB — HEPATIC FUNCTION PANEL
Alkaline Phosphatase: 62 U/L (ref 39–117)
Bilirubin, Direct: 0 mg/dL (ref 0.0–0.3)
Total Bilirubin: 0.7 mg/dL (ref 0.3–1.2)
Total Protein: 6.1 g/dL (ref 6.0–8.3)

## 2011-01-19 LAB — CBC WITH DIFFERENTIAL/PLATELET
Basophils Relative: 0.4 % (ref 0.0–3.0)
Eosinophils Absolute: 0.4 10*3/uL (ref 0.0–0.7)
Lymphocytes Relative: 23.5 % (ref 12.0–46.0)
MCHC: 33.5 g/dL (ref 30.0–36.0)
Neutrophils Relative %: 64.2 % (ref 43.0–77.0)
Platelets: 272 10*3/uL (ref 150.0–400.0)
RBC: 4.12 Mil/uL (ref 3.87–5.11)
WBC: 7 10*3/uL (ref 4.5–10.5)

## 2011-01-19 NOTE — Assessment & Plan Note (Signed)
Improved. Continue Simvastatin at current dose.

## 2011-01-19 NOTE — Assessment & Plan Note (Addendum)
New.  EKG- normal sinus rhythm. Will check labs- TSH, CBC, liver function. Will refer to cardiology for further workup/stress test.

## 2011-01-19 NOTE — Assessment & Plan Note (Signed)
Deteriorated. Refer to GYN for further work up.   Discussed treatment options- biofeedback, surgery, pessary.

## 2011-01-19 NOTE — Patient Instructions (Signed)
Please stop by to see Robin Ellis on your way out. 

## 2011-01-19 NOTE — Progress Notes (Signed)
70 yo here for follow up lipids.  HLD- Overall lipids look good. LDL 81, HDL 70, TG mildly elevated at 193.  On Simvastatin 20 mg daily.  No myalgias.  Palpitations- multiple occassions over past several months when she has been walking on treadmill and will get palpitations.  Sometimes associated with dizziness.  No CP.  At times, has had sensation at rest but more commonly while walking.  Urinary incontinence- ongoing problem for years.  Per pt, diagnosed with cystocele years ago.  On amitriptyline 25 mg.  Symptoms getting worse- now both urge and stress incontinence.   The PMH, PSH, Social History, Family History, Medications, and allergies have been reviewed in [facility], and have been updated if relevant.  Review of Systems       See HPI General:  Denies fatigue. Eyes:  Denies blurring. ENT:  Denies difficulty swallowing. CV:  Denies chest pain or discomfort,  Complains of palpitations Resp:  Denies shortness of breath. GI:  Denies bloody stools, nausea, and vomiting. GU:  Denies abnormal vaginal bleeding. MS:  Denies joint pain, joint redness, and joint swelling. Derm:  Denies rash. Neuro:  Complains of dizziness; denies falling down. Psych:  Denies anxiety and easily angered. Endo:  Denies excessive hunger and heat intolerance.  Physical Exam BP 120/70  Pulse 79  Temp(Src) 98 F (36.7 C) (Oral)  Ht 5' 1.5" (1.562 m)  Wt 121 lb 12.8 oz (55.248 kg)  BMI 22.64 kg/m2  SpO2 96%  General:  alert, well-developed, well-nourished, and well-hydrated.   Head:  Normocephalic and atraumatic without obvious abnormalities. No apparent alopecia or balding. Eyes:  PERRLA, Conjunctiva clear bilaterally.  Ears:  External ear exam shows no significant lesions or deformities.  Otoscopic examination reveals clear canals, tympanic membranes are intact bilaterally without bulging, retraction, inflammation or discharge. Hearing is grossly normal bilaterally. Nose:  mucosal erythema, mucosal edema,  and airflow obstruction--mouth breathing.   Mouth:  Oral mucosa and oropharynx without lesions or exudates.  Teeth in good repair. Lungs:  Normal respiratory effort, chest expands symmetrically. Lungs are clear to auscultation, no crackles or wheezes. Heart:  Normal rate and regular rhythm. S1 and S2 normal without gallop, murmur, click, rub or other extra sounds. Abdomen:  soft and non-tender.   Msk:   normal ROM, no joint tenderness, and no joint swelling.   Extremities:  no edema Neurologic:  alert & oriented X3 and gait normal.   Psych:  Cognition and judgment appear intact. Alert and cooperative with normal attention span and concentration. No apparent delusions, illusions, hallucinations

## 2011-01-27 ENCOUNTER — Encounter: Payer: Self-pay | Admitting: Cardiovascular Disease

## 2011-01-27 ENCOUNTER — Ambulatory Visit (INDEPENDENT_AMBULATORY_CARE_PROVIDER_SITE_OTHER): Payer: MEDICARE | Admitting: Cardiovascular Disease

## 2011-01-27 VITALS — BP 110/82 | HR 77 | Ht 61.0 in | Wt 120.0 lb

## 2011-01-27 DIAGNOSIS — R002 Palpitations: Secondary | ICD-10-CM

## 2011-01-27 DIAGNOSIS — E785 Hyperlipidemia, unspecified: Secondary | ICD-10-CM

## 2011-01-27 DIAGNOSIS — R55 Syncope and collapse: Secondary | ICD-10-CM | POA: Insufficient documentation

## 2011-01-27 NOTE — Progress Notes (Signed)
   Patient ID: Robin Ellis, female    DOB: 03-13-1941, 70 y.o.   MRN: [id number]  HPI Comments: Robin Ellis is a very pleasant 70 year old woman with a history of hyperlipidemia, colitis/irritable bowel who presents by referral from Dr. Dayton Martes for palpitations and lightheadedness.  She reports that her symptoms have been going for a least 2 years. She wonders if it might be more frequent recently. They occur approximately one time per week and are brief lasting for several seconds at a time. She describes it as a sudden change in her heart rate, sometimes faster, sometimes lower sometimes associated with lightheadedness. She has never passed out. She has noticed them sometimes at rest while at the kitchen table and sometimes while on the treadmill. She denies any chest pain, shortness of breath with these episodes.  She works out on a treadmill 45 minutes at a time twice per week and typically does not have any symptoms. She has noticed on the heart rate monitor that her heart rate occasionally speeds up and then dropped off and she has a very brief shaky feeling that resolves. She is able to continue exercising.  She also reports having cold feet though this has been a chronic issue.  EKG today shows normal sinus rhythm with rate 76 beats per minute with no significant ST or T wave changes     Review of Systems  Constitutional: Negative.   HENT: Negative.   Eyes: Negative.   Respiratory: Negative.   Cardiovascular: Positive for palpitations.       Tachycardia  Gastrointestinal: Negative.   Musculoskeletal: Negative.   Skin: Negative.   Neurological: Positive for light-headedness.  Hematological: Negative.   Psychiatric/Behavioral: Negative.   All other systems reviewed and are negative.   BP 110/82  Pulse 77  Ht 5\' 1"  (1.549 m)  Wt 120 lb (54.432 kg)  BMI 22.67 kg/m2   Physical Exam  Nursing note and vitals reviewed. Constitutional: She is oriented to person, place, and time.  She appears well-developed and well-nourished.  HENT:  Head: Normocephalic.  Nose: Nose normal.  Mouth/Throat: Oropharynx is clear and moist.  Eyes: Conjunctivae are normal. Pupils are equal, round, and reactive to light.  Neck: Normal range of motion. Neck supple. No JVD present.  Cardiovascular: Normal rate, regular rhythm, normal heart sounds and intact distal pulses.  Exam reveals no gallop and no friction rub.   No murmur heard. Pulmonary/Chest: Effort normal and breath sounds normal. No respiratory distress. She has no wheezes. She has no rales. She exhibits no tenderness.  Abdominal: Soft. Bowel sounds are normal. She exhibits no distension. There is no tenderness.  Musculoskeletal: Normal range of motion. She exhibits no edema and no tenderness.  Lymphadenopathy:    She has no cervical adenopathy.  Neurological: She is alert and oriented to person, place, and time. Coordination normal.  Skin: Skin is warm and dry. No rash noted. No erythema.  Psychiatric: She has a normal mood and affect. Her behavior is normal. Judgment and thought content normal.         Assessment and Plan

## 2011-01-27 NOTE — Assessment & Plan Note (Signed)
I commended her on the drop in her cholesterol on the medication. I encouraged her to stay on the medication for now. She does not seem to have any anginal symptoms.

## 2011-01-27 NOTE — Assessment & Plan Note (Signed)
Etiology of her near syncope episodes is uncertain. Her blood pressure is borderline low. I'm concerned about electrolyte issues given her chronic colitis issues. If she is hypotensive, this would give her episodes of lightheadedness as well. She is not on any blood pressure medications that might be decreasing her blood pressure. I suggested she increase her salt intake mildly in an effort to raise her blood pressure to avoid further near syncope episodes.  Again if she has more frequent symptoms of near syncope, have asked her to contact us as this could be a sign of underlying arrhythmia. Further workup would include a Holter or event monitor.

## 2011-01-27 NOTE — Patient Instructions (Addendum)
Please keep a diary of your palpitations, fast rhythms or drops in heart rate. If they become more frequent, please call the cardiology office. Increase salt and fluid intake to keep blood pressure elevated.  We will call you for a follow up appt in 3 months Please call for earlier appt if symptoms are getting worse.

## 2011-01-27 NOTE — Assessment & Plan Note (Signed)
We spent a significant time talking about the various etiologies of her tachypalpitations and possible decrease in her heart rate and dizziness. It certainly could be she has an underlying arrhythmia. Her symptoms are rare. We did discuss that we could perform a Holter monitor or event monitor. We have discussed these modalities with her as well.   She would like to wait for now on any testing or monitors. She will keep a diary of the frequency and duration of her episodes. If her episodes become more frequent, she will contact our office. We'll set her up for close followup in several months time. She does not seem particular concerned at this time with her episodes.

## 2011-02-05 ENCOUNTER — Other Ambulatory Visit: Payer: Self-pay | Admitting: Internal Medicine

## 2011-03-20 ENCOUNTER — Other Ambulatory Visit: Payer: Self-pay | Admitting: *Deleted

## 2011-03-20 MED ORDER — ESTRADIOL 0.5 MG PO TABS: 0.5000 mg | ORAL_TABLET | Freq: Every day | ORAL | Status: DC

## 2011-05-08 ENCOUNTER — Other Ambulatory Visit: Payer: Self-pay | Admitting: Internal Medicine

## 2011-05-08 NOTE — Telephone Encounter (Signed)
Medication refilled

## 2011-07-19 ENCOUNTER — Other Ambulatory Visit: Payer: Self-pay | Admitting: Internal Medicine

## 2011-07-23 ENCOUNTER — Ambulatory Visit (INDEPENDENT_AMBULATORY_CARE_PROVIDER_SITE_OTHER): Payer: MEDICARE

## 2011-07-23 DIAGNOSIS — Z23 Encounter for immunization: Secondary | ICD-10-CM

## 2011-08-03 ENCOUNTER — Other Ambulatory Visit: Payer: Self-pay | Admitting: Family Medicine

## 2011-08-03 DIAGNOSIS — Z1231 Encounter for screening mammogram for malignant neoplasm of breast: Secondary | ICD-10-CM

## 2011-08-08 ENCOUNTER — Other Ambulatory Visit: Payer: Self-pay | Admitting: Internal Medicine

## 2011-08-10 ENCOUNTER — Other Ambulatory Visit: Payer: Self-pay | Admitting: *Deleted

## 2011-08-10 MED ORDER — SIMVASTATIN 10 MG PO TABS: 10.0000 mg | ORAL_TABLET | Freq: Every day | ORAL | Status: DC

## 2011-08-11 ENCOUNTER — Other Ambulatory Visit: Payer: Self-pay | Admitting: Internal Medicine

## 2011-08-11 NOTE — Telephone Encounter (Signed)
Patient informed of Dr. Marvell Fuller suggestions/orders. Medication refilled for 2 months.

## 2011-08-11 NOTE — Telephone Encounter (Signed)
Patient was last seen 07/2010. Do you want to fill amitriptyline or does the patient need to be seen?

## 2011-08-11 NOTE — Telephone Encounter (Signed)
Med refilled.

## 2011-08-11 NOTE — Telephone Encounter (Signed)
We can refill for 2 months I recommend that if she is doing well that she have her PCP take over the refills and see me if there are remaining problems with IBS

## 2011-08-31 ENCOUNTER — Emergency Department (HOSPITAL_COMMUNITY): Payer: Medicare Other

## 2011-08-31 ENCOUNTER — Ambulatory Visit (INDEPENDENT_AMBULATORY_CARE_PROVIDER_SITE_OTHER): Payer: MEDICARE | Admitting: Family Medicine

## 2011-08-31 ENCOUNTER — Emergency Department (HOSPITAL_COMMUNITY)
Admission: EM | Admit: 2011-08-31 | Discharge: 2011-08-31 | Disposition: A | Discharge status: Home / Self Care | Payer: Medicare Other | Attending: Emergency Medicine | Admitting: Emergency Medicine

## 2011-08-31 ENCOUNTER — Encounter: Payer: Self-pay | Admitting: Family Medicine

## 2011-08-31 ENCOUNTER — Encounter (HOSPITAL_COMMUNITY): Payer: Self-pay | Admitting: *Deleted

## 2011-08-31 VITALS — BP 140/70 | HR 84 | Temp 97.5°F | Ht 61.0 in | Wt 124.8 lb

## 2011-08-31 DIAGNOSIS — K8050 Calculus of bile duct without cholangitis or cholecystitis without obstruction: Secondary | ICD-10-CM

## 2011-08-31 DIAGNOSIS — R10819 Abdominal tenderness, unspecified site: Secondary | ICD-10-CM | POA: Insufficient documentation

## 2011-08-31 DIAGNOSIS — D72829 Elevated white blood cell count, unspecified: Secondary | ICD-10-CM | POA: Insufficient documentation

## 2011-08-31 DIAGNOSIS — R1011 Right upper quadrant pain: Secondary | ICD-10-CM

## 2011-08-31 DIAGNOSIS — Z79899 Other long term (current) drug therapy: Secondary | ICD-10-CM | POA: Insufficient documentation

## 2011-08-31 DIAGNOSIS — M810 Age-related osteoporosis without current pathological fracture: Secondary | ICD-10-CM | POA: Insufficient documentation

## 2011-08-31 DIAGNOSIS — K8020 Calculus of gallbladder without cholecystitis without obstruction: Secondary | ICD-10-CM | POA: Insufficient documentation

## 2011-08-31 DIAGNOSIS — R110 Nausea: Secondary | ICD-10-CM

## 2011-08-31 LAB — CBC
MCH: 29.4 pg (ref 26.0–34.0)
MCHC: 32.6 g/dL (ref 30.0–36.0)
Platelets: 319 10*3/uL (ref 150–400)
RBC: 4.25 MIL/uL (ref 3.87–5.11)
RDW: 13.4 % (ref 11.5–15.5)

## 2011-08-31 LAB — COMPREHENSIVE METABOLIC PANEL
ALT: 18 U/L (ref 0–35)
AST: 28 U/L (ref 0–37)
Albumin: 3.7 g/dL (ref 3.5–5.2)
Calcium: 9.8 mg/dL (ref 8.4–10.5)
Creatinine, Ser: 0.9 mg/dL (ref 0.50–1.10)
Sodium: 138 mEq/L (ref 135–145)
Total Protein: 7 g/dL (ref 6.0–8.3)

## 2011-08-31 LAB — URINALYSIS, ROUTINE W REFLEX MICROSCOPIC
Glucose, UA: NEGATIVE mg/dL
Hgb urine dipstick: NEGATIVE
Leukocytes, UA: NEGATIVE
Specific Gravity, Urine: 1.014 (ref 1.005–1.030)

## 2011-08-31 MED ORDER — HYDROMORPHONE HCL PF 2 MG/ML IJ SOLN
INTRAMUSCULAR | Status: AC
  Filled 2011-08-31: qty 1

## 2011-08-31 MED ORDER — OXYCODONE-ACETAMINOPHEN 5-325 MG PO TABS: 1.0000 | ORAL_TABLET | ORAL | Status: AC | PRN

## 2011-08-31 MED ORDER — HYDROMORPHONE HCL PF 1 MG/ML IJ SOLN: 0.5000 mg | Freq: Once | INTRAMUSCULAR | Status: DC

## 2011-08-31 MED ORDER — ONDANSETRON HCL 4 MG/2ML IJ SOLN
4.0000 mg | Freq: Once | INTRAMUSCULAR | Status: AC
  Administered 2011-08-31: 4 mg via INTRAVENOUS
  Filled 2011-08-31: qty 2

## 2011-08-31 MED ORDER — ONDANSETRON HCL 4 MG PO TABS: 8.0000 mg | ORAL_TABLET | Freq: Four times a day (QID) | ORAL | Status: AC

## 2011-08-31 MED ORDER — HYDROMORPHONE HCL PF 2 MG/ML IJ SOLN: 2.0000 mg | Freq: Once | INTRAMUSCULAR | Status: DC

## 2011-08-31 MED ORDER — HYDROMORPHONE HCL PF 2 MG/ML IJ SOLN
0.5000 mg | INTRAMUSCULAR | Status: AC
  Administered 2011-08-31: 0.5 mg via INTRAVENOUS

## 2011-08-31 MED ORDER — PROMETHAZINE HCL 25 MG/ML IJ SOLN
25.0000 mg | Freq: Once | INTRAMUSCULAR | Status: AC
  Administered 2011-08-31: 25 mg via INTRAMUSCULAR

## 2011-08-31 MED ORDER — SODIUM CHLORIDE 0.9 % IV SOLN: Freq: Once | INTRAVENOUS | Status: DC

## 2011-08-31 MED ORDER — SODIUM CHLORIDE 0.9 % IV BOLUS (SEPSIS)
500.0000 mL | Freq: Once | INTRAVENOUS | Status: AC
  Administered 2011-08-31: 500 mL via INTRAVENOUS

## 2011-08-31 NOTE — ED Notes (Signed)
Patient transported to Ultrasound 

## 2011-08-31 NOTE — Progress Notes (Signed)
Subjective:    Patient ID: Robin Ellis, female    DOB: 10-24-1940, 70 y.o.   MRN: [id number]  HPI 70 yo here for acute onset of RUQ pain.  Started abruptly approximately 45 minutes ago. Associated with nausea, not vomited yet. No fevers or chills.  She does still have a gall bladder.  No diarrhea.    Never had anything like this before.  Patient Active Problem List  Diagnoses  . HYPERLIPIDEMIA  . COMMON MIGRAINE  . CERUMEN IMPACTION, BILATERAL  . PNEUMONIA  . ESOPHAGEAL STRICTURE  . GERD  . LYMPHOCYTIC COLITIS  . DIVERTICULOSIS-COLON  . IBS  . TIBIALIS TENDINITIS  . OSTEOPOROSIS  . HEADACHE  . FLATULENCE-GAS-BLOATING  . URINARY INCONTINENCE  . GROIN STRAIN, RIGHT  . OPEN WOUND FOREARM WITHOUT MENTION COMPLICATION  . ABRASION, LEG  . Palpitations  . Near syncope  . Right upper quadrant pain   Past Medical History  Diagnosis Date  . Osteoporosis   . Urinary incontinence   . Migraines   . Lymphocytic colitis   . GERD (gastroesophageal reflux disease)     esophageal stricture  . IBS (irritable bowel syndrome)    Past Surgical History  Procedure Date  . Shoulder surgery 10/2006    left   . Vaginal hysterectomy     no oopherectomy   History  Substance Use Topics  . Smoking status: Former Smoker -- 1.0 packs/day for 10 years    Types: Cigarettes    Quit date: 01/22/1991  . Smokeless tobacco: Former Neurosurgeon    Quit date: 01/15/1991  . Alcohol Use: No   Family History  Problem Relation Age of Onset  . Cancer Mother     myeloma, multiple   Allergies  Allergen Reactions  . Cefdinir     REACTION: diarrhea  . Codeine     REACTION: nausea  . Hydrocodone-Acetaminophen     REACTION: nausea   Current Outpatient Prescriptions on File Prior to Visit  Medication Sig Dispense Refill  . amitriptyline (ELAVIL) 25 MG tablet TAKE 1 TABLET BY MOUTH AT BEDTIME  30 tablet  1  . Ascorbic Acid (VITAMIN C) 500 MG tablet Take 500 mg by mouth daily.        Marland Kitchen  aspirin-acetaminophen-caffeine ([employer] MIGRAINE RELIEF) 250-250-65 MG per tablet Take 1 tablet by mouth every 6 (six) hours as needed.        . Calcium Carbonate-Vit D-Min (CALCIUM 1200) 1200-1000 MG-UNIT CHEW Chew 1 tablet by mouth 2 (two) times daily.        . cholecalciferol (VITAMIN D) 1000 UNITS tablet Take 1,000 Units by mouth daily.        Marland Kitchen estradiol (ESTRACE) 0.5 MG tablet Take 1 tablet (0.5 mg total) by mouth daily.  30 tablet  5  . fish oil-omega-3 fatty acids 1000 MG capsule Take 1 g by mouth daily.        . hyoscyamine (LEVSIN SL) 0.125 MG SL tablet Place 0.125 mg under the tongue every 6 (six) hours as needed.        . mometasone (NASONEX) 50 MCG/ACT nasal spray 2 sprays by Nasal route daily.        . pantoprazole (PROTONIX) 40 MG tablet TAKE 1 TABLET BY MOUTH ONCE DAILY 30-60 MINUTES PRIOR TO BREAKFAST  30 tablet  5  . Probiotic Product (ALIGN PO) Take 1 tablet by mouth daily.        . simvastatin (ZOCOR) 10 MG tablet Take 1 tablet (10 mg total)  by mouth at bedtime.  30 tablet  6  . zonisamide (ZONEGRAN) 100 MG capsule Take 200 mg by mouth daily.       Marland Kitchen estrogens, conjugated, (PREMARIN) 0.3 MG tablet Take 0.3 mg by mouth daily. Take daily for 21 days then do not take for 7 days.       Marland Kitchen zonisamide (ZONEGRAN) 25 MG capsule Take 25 mg by mouth 3 (three) times daily.         The PMH, PSH, Social History, Family History, Medications, and allergies have been reviewed in [facility], and have been updated if relevant.    Review of Systems    See HPI  Objective:   Physical Exam  BP 140/70  Pulse 84  Temp(Src) 97.5 F (36.4 C) (Oral)  Ht 5\' 1"  (1.549 m)  Wt 124 lb 12.8 oz (56.609 kg)  BMI 23.58 kg/m2  SpO2 100%  General:  Well-developed,appears very uncomfortable. Head:  normocephalic and atraumatic.   Eyes:  vision grossly intact, pupils equal, pupils round, and pupils reactive to light.   Ears:  R ear normal and L ear normal.   Nose:  no external deformity.   Mouth:  good  dentition.   Lungs:  Normal respiratory effort, chest expands symmetrically. Lungs are clear to auscultation, no crackles or wheezes. Heart:  Normal rate and regular rhythm. S1 and S2 normal without gallop, murmur, click, rub or other extra sounds. Abdomen:  Bowel sounds positive,abdomen soft, very ttp over right up quadrant, pos guarding, ?rebound Skin:  Appears a little jaundice and pale Psych:  Cognition and judgment appear intact. Alert and cooperative with normal attention span and concentration. No apparent delusions, illusions, hallucinations     Assessment & Plan:   1. Right upper quadrant pain    New.  Very concerning for acute gall bladder process/acute abdomen. Given phenergan 25 mg IM x 1 in office. Called [facility] ER to let them know she is coming, husband wants to drive her.

## 2011-08-31 NOTE — ED Notes (Signed)
Pt given discharge instructions and verbalizes understanding  

## 2011-08-31 NOTE — ED Provider Notes (Signed)
History     CSN: [id number] Arrival date & time: 08/31/2011  2:48 PM   First MD Initiated Contact with Patient 08/31/11 1506      Chief Complaint  Patient presents with  . Abdominal Pain    Pt c/o RUQ abd pain that began at 12PM today. Pt was seen by PCP pta and suspects "gallbladder" Pt reports nausea, denies vomiting or diarrhea.     (Consider location/radiation/quality/duration/timing/severity/associated sxs/prior treatment) HPI Patient developed gradual constant worsening right upper quadrant pain today with associated nausea without vomiting.  She denies fever and chills.  She denies diarrhea.  She denies prior history of gallstones.  She reports no intermittent pain with food.  Nothing worsens her symptoms.  Nothing improves her symptoms.  Her pain is constant.  It is moderate to severe.  Past Medical History  Diagnosis Date  . Osteoporosis   . Urinary incontinence   . Migraines   . Lymphocytic colitis   . GERD (gastroesophageal reflux disease)     esophageal stricture  . IBS (irritable bowel syndrome)     Past Surgical History  Procedure Date  . Shoulder surgery 10/2006    left   . Vaginal hysterectomy     no oopherectomy    Family History  Problem Relation Age of Onset  . Cancer Mother     myeloma, multiple    History  Substance Use Topics  . Smoking status: Former Smoker -- 1.0 packs/day for 10 years    Types: Cigarettes    Quit date: 01/22/1991  . Smokeless tobacco: Former Neurosurgeon    Quit date: 01/15/1991  . Alcohol Use: No    OB History    Grav Para Term Preterm Abortions TAB SAB Ect Mult Living                  Review of Systems  All other systems reviewed and are negative.    Allergies  Cefdinir; Codeine; and Hydrocodone-acetaminophen  Home Medications   Current Outpatient Rx  Name Route Sig Dispense Refill  . AMITRIPTYLINE HCL 25 MG PO TABS  TAKE 1 TABLET BY MOUTH AT BEDTIME 30 tablet 1  . ASPIRIN-ACETAMINOPHEN-CAFFEINE 250-250-65 MG  PO TABS Oral Take 1 tablet by mouth every 6 (six) hours as needed.      Marland Kitchen CALCIUM 1200 1200-1000 MG-UNIT PO CHEW Oral Chew 1 tablet by mouth 2 (two) times daily.      Marland Kitchen VITAMIN D 1000 UNITS PO TABS Oral Take 1,000 Units by mouth daily.      Marland Kitchen ESTRADIOL 0.5 MG PO TABS Oral Take 1 tablet (0.5 mg total) by mouth daily. 30 tablet 5  . ESTROGENS CONJUGATED 0.3 MG PO TABS Oral Take 0.3 mg by mouth daily. Take daily for 21 days then do not take for 7 days.     . OMEGA-3 FATTY ACIDS 1000 MG PO CAPS Oral Take 1 g by mouth daily.      Marland Kitchen HYOSCYAMINE SULFATE 0.125 MG SL SUBL Sublingual Place 0.125 mg under the tongue every 6 (six) hours as needed.      Marland Kitchen PANTOPRAZOLE SODIUM 40 MG PO TBEC  TAKE 1 TABLET BY MOUTH ONCE DAILY 30-60 MINUTES PRIOR TO BREAKFAST 30 tablet 5  . ALIGN PO Oral Take 1 tablet by mouth daily.      Marland Kitchen SIMVASTATIN 10 MG PO TABS Oral Take 1 tablet (10 mg total) by mouth at bedtime. 30 tablet 6  . TETRAHYDROZOLINE HCL 0.05 % OP SOLN Both Eyes Place  2 drops into both eyes 2 (two) times daily. As needed for dryness of eyes     . ZONISAMIDE 100 MG PO CAPS Oral Take 200 mg by mouth daily.     Marland Kitchen VITAMIN C 500 MG PO TABS Oral Take 500 mg by mouth daily.      . MOMETASONE FUROATE 50 MCG/ACT NA SUSP Nasal 2 sprays by Nasal route daily.      Marland Kitchen ZONISAMIDE 25 MG PO CAPS Oral Take 25 mg by mouth 3 (three) times daily.        BP 109/77  Pulse 78  Temp(Src) 98 F (36.7 C) (Oral)  Resp 18  Wt 124 lb (56.246 kg)  SpO2 96%  Physical Exam  Nursing note and vitals reviewed. Constitutional: She is oriented to person, place, and time. She appears well-developed and well-nourished. No distress.  HENT:  Head: Normocephalic and atraumatic.  Eyes: EOM are normal.  Neck: Normal range of motion.  Cardiovascular: Normal rate, regular rhythm and normal heart sounds.   Pulmonary/Chest: Effort normal and breath sounds normal.  Abdominal: Soft. She exhibits no distension.       Mild tenderness in the right  upper quadrant and epigastric region without guarding or rebound  Musculoskeletal: Normal range of motion.  Neurological: She is alert and oriented to person, place, and time.  Skin: Skin is warm and dry.  Psychiatric: She has a normal mood and affect. Judgment normal.    ED Course  Procedures (including critical care time)  Labs Reviewed  CBC - Abnormal; Notable for the following:    WBC 15.4 (*)    All other components within normal limits  COMPREHENSIVE METABOLIC PANEL - Abnormal; Notable for the following:    Potassium 3.4 (*)    Glucose, Bld 112 (*)    GFR calc non Af Amer 62 (*)    GFR calc Af Amer 72 (*)    All other components within normal limits  LIPASE, BLOOD - Abnormal; Notable for the following:    Lipase 62 (*)    All other components within normal limits  URINALYSIS, ROUTINE W REFLEX MICROSCOPIC - Abnormal; Notable for the following:    Appearance CLOUDY (*)    All other components within normal limits   US Abdomen Complete  08/31/2011  *RADIOLOGY REPORT*  Clinical Data:  Right upper quadrant pain  ULTRASOUND ABDOMEN:  Technique:  Sonography of upper abdominal structures was performed.  Comparison:  None  Gallbladder:  Few small dependent nonshadowing echogenic foci seen within gallbladder lumen, question nonshadowing calculi versus sludge.  No gallbladder wall thickening or pericholecystic fluid. Unable to accurately assess for presence of a sonographic Murphy's sign due to prior administration of pain medication.  Common bile duct:  Normal caliber for age 23 mm diameter  Liver:  Normal appearance  IVC:  Normal appearance  Pancreas:  Normal appearance  Spleen:  Normal appearance, 7.1 cm length  Right kidney:  10.1 cm length. Normal morphology without mass or hydronephrosis.  Left kidney:  9.6 cm length. Normal morphology without mass or hydronephrosis.  Aorta:  Normal caliber  Other:  No free fluid  IMPRESSION: Dependent echogenic nonshadowing foci within gallbladder lumen  question nonshadowing calculi versus sludge. Otherwise negative exam.  Original Report Authenticated By: Lollie Marrow, M.D.     1. Abdominal pain   2. Biliary colic   3. Leucocytosis       MDM  Given pain in the right upper quadrant ultrasound was obtained demonstrating sludge  and stones without other signs of cholecystitis specifically no gallbladder thickening or pericholecystic fluid.  Repeat abdominal exam demonstrates almost a nontender abdomen at this time.  Given the patient's or blood cell count of 15,000 and the constant nature of her pain today discussed the case with the general surgeon on call Dr. Abbey Chatters.  We had a prolonged discussion and his recommendations are that if she is nontender to be followed up closely in the general surgery office.  I spoke with the patient and she will call the office tomorrow morning for expedient followup.  She understands to return to the ER for new or worsening symptoms specifically worsening abdominal pain fever greater than 101 or severe nausea and vomiting.  She'll be put on 24 hour clear liquid diet and then a low-fat diet after that.  Both she and her husband understand and all questions were answered.      Lyanne Co, MD 08/31/11 [phone]

## 2011-08-31 NOTE — ED Notes (Signed)
Neighbor states that the patient is significantly different than two weeks ago being that she was not shaking and had no memory impairments. States that she took her to [employer] this am and the patient didn't know where she was even after being told several times. States that she was coming to her home to take her to the MD when she found her outside on the ground.

## 2011-09-07 ENCOUNTER — Other Ambulatory Visit: Payer: Self-pay | Admitting: Family Medicine

## 2011-09-08 MED ORDER — SIMVASTATIN 10 MG PO TABS: 10.0000 mg | ORAL_TABLET | Freq: Every day | ORAL | Status: DC

## 2011-09-08 NOTE — Telephone Encounter (Signed)
Addended by: Gilmer Mor on: 09/08/2011 11:47 AM   Modules accepted: Orders

## 2011-09-14 ENCOUNTER — Ambulatory Visit (HOSPITAL_COMMUNITY)
Admission: RE | Admit: 2011-09-14 | Discharge: 2011-09-14 | Disposition: A | Discharge status: Home / Self Care | Payer: MEDICARE | Source: Ambulatory Visit | Attending: Family Medicine | Admitting: Family Medicine

## 2011-09-14 DIAGNOSIS — Z1231 Encounter for screening mammogram for malignant neoplasm of breast: Secondary | ICD-10-CM | POA: Insufficient documentation

## 2011-09-17 ENCOUNTER — Encounter: Payer: Self-pay | Admitting: *Deleted

## 2011-09-21 ENCOUNTER — Other Ambulatory Visit: Payer: Self-pay | Admitting: *Deleted

## 2011-09-21 MED ORDER — ESTRADIOL 0.5 MG PO TABS: 0.5000 mg | ORAL_TABLET | Freq: Every day | ORAL | Status: DC

## 2011-09-23 ENCOUNTER — Encounter (INDEPENDENT_AMBULATORY_CARE_PROVIDER_SITE_OTHER): Payer: Self-pay | Admitting: General Surgery

## 2011-09-23 ENCOUNTER — Ambulatory Visit (INDEPENDENT_AMBULATORY_CARE_PROVIDER_SITE_OTHER): Payer: Medicare Other | Admitting: General Surgery

## 2011-09-23 VITALS — BP 124/86 | HR 60 | Temp 97.0°F | Resp 16 | Ht 62.0 in | Wt 122.4 lb

## 2011-09-23 DIAGNOSIS — K8020 Calculus of gallbladder without cholecystitis without obstruction: Secondary | ICD-10-CM | POA: Insufficient documentation

## 2011-09-23 NOTE — Progress Notes (Signed)
Patient ID: Robin Ellis, female   DOB: 06/05/1941, 70 y.o.   MRN: [id number]  Chief Complaint  Patient presents with  . New Evaluation    eval of GB     HPI Robin Ellis is a 70 y.o. female.  HPI    She is referred by Dr. Patria Mane from the emergency department for evaluation of symptomatic cholelithiasis.  She had a fatty meal back in mid November then presented with right upper quadrant pain and nausea. She went to the emergency department and was noted to have normal liver function tests are slightly elevated lipase level and a white blood cell count of 15,000. However, while in the emergency department she became pain-free. An ultrasound demonstrated small stones and sludge in the gallbladder. No signs of acute cholecystitis. She was discharged on low-fat diet was doing well until she ate some fast food chicken that had a limited episode of biliary colic. She is here for further evaluation and treatment for gallbladder disease. Past Medical History  Diagnosis Date  . Osteoporosis   . Urinary incontinence   . Migraines   . Lymphocytic colitis   . GERD (gastroesophageal reflux disease)     esophageal stricture  . IBS (irritable bowel syndrome)   . Cataract     Past Surgical History  Procedure Date  . Shoulder surgery 10/2006    left   . Vaginal hysterectomy     no oopherectomy    Family History  Problem Relation Age of Onset  . Cancer Mother     myeloma, multiple    Social History History  Substance Use Topics  . Smoking status: Former Smoker -- 1.0 packs/day for 10 years    Types: Cigarettes    Quit date: 01/22/1991  . Smokeless tobacco: Former Neurosurgeon    Quit date: 01/15/1991  . Alcohol Use: No    Allergies  Allergen Reactions  . Cefdinir     REACTION: diarrhea  . Codeine     REACTION: nausea  . Hydrocodone-Acetaminophen     REACTION: nausea    Current Outpatient Prescriptions  Medication Sig Dispense Refill  . amitriptyline (ELAVIL) 25 MG tablet  TAKE 1 TABLET BY MOUTH AT BEDTIME  30 tablet  1  . Ascorbic Acid (VITAMIN C) 500 MG tablet Take 500 mg by mouth daily.        Marland Kitchen aspirin-acetaminophen-caffeine ([employer] MIGRAINE RELIEF) 250-250-65 MG per tablet Take 1 tablet by mouth every 6 (six) hours as needed.        . Calcium Carbonate-Vit D-Min (CALCIUM 1200) 1200-1000 MG-UNIT CHEW Chew 1 tablet by mouth 2 (two) times daily.        . cholecalciferol (VITAMIN D) 1000 UNITS tablet Take 1,000 Units by mouth daily.        Marland Kitchen estradiol (ESTRACE) 0.5 MG tablet Take 1 tablet (0.5 mg total) by mouth daily.  30 tablet  5  . fish oil-omega-3 fatty acids 1000 MG capsule Take 1 g by mouth daily.        . hyoscyamine (LEVSIN SL) 0.125 MG SL tablet Place 0.125 mg under the tongue every 6 (six) hours as needed.        . mometasone (NASONEX) 50 MCG/ACT nasal spray 2 sprays by Nasal route daily.        . pantoprazole (PROTONIX) 40 MG tablet TAKE 1 TABLET BY MOUTH ONCE DAILY 30-60 MINUTES PRIOR TO BREAKFAST  30 tablet  5  . Probiotic Product (ALIGN PO) Take 1 tablet by  mouth daily.        . simvastatin (ZOCOR) 10 MG tablet TAKE 1 TABLET BY MOUTH AT BEDTIME  30 tablet  6  . tetrahydrozoline (VISINE) 0.05 % ophthalmic solution Place 2 drops into both eyes 2 (two) times daily. As needed for dryness of eyes       . zonisamide (ZONEGRAN) 100 MG capsule Take 200 mg by mouth daily.         Review of Systems Review of Systems  Constitutional: Negative for fever, chills and unexpected weight change.  HENT: Positive for rhinorrhea.   Eyes: Positive for visual disturbance (cataracts).  Respiratory: Negative.   Cardiovascular: Negative.   Gastrointestinal:       As per HPI  Genitourinary: Negative for dysuria and frequency.  Neurological: Positive for headaches.  Hematological: Negative.     Blood pressure 124/86, pulse 60, temperature 97 F (36.1 C), temperature source Temporal, resp. rate 16, height 5\' 2"  (1.575 m), weight 122 lb 6 oz (55.509 kg).  Physical  Exam Physical Exam  Constitutional: She appears well-developed and well-nourished. No distress.  HENT:  Head: Normocephalic and atraumatic.  Eyes: Conjunctivae and EOM are normal.  Neck: Neck supple.  Cardiovascular: Normal rate and regular rhythm.   Pulmonary/Chest: Effort normal and breath sounds normal.  Abdominal: Soft. She exhibits no distension and no mass. There is no tenderness.  Musculoskeletal: Normal range of motion. She exhibits no edema.  Lymphadenopathy:    She has no cervical adenopathy.  Skin: Skin is warm and dry.  Psychiatric: She has a normal mood and affect. Her behavior is normal.    Data Reviewed  Notes and results from her emergency department visit.  Assessment    Symptomatic cholelithiasis without evidence of cholecystitis    Plan   Strict lowfat diet.  Laparoscopic cholecystectomy.  I have explained the procedure, risks, and aftercare of cholecystectomy.  Risks include but are not limited to bleeding, infection, wound problems, anesthesia, diarrhea, bile leak, injury to common bile duct/liver/intestine.  She seems to understand and agrees to proceed.       Korry Dalgleish J 09/23/2011, 12:50 PM

## 2011-09-23 NOTE — Patient Instructions (Signed)
Strict lowfat diet. 

## 2011-10-02 ENCOUNTER — Encounter (HOSPITAL_COMMUNITY): Payer: Self-pay | Admitting: Respiratory Therapy

## 2011-10-14 ENCOUNTER — Other Ambulatory Visit: Payer: Self-pay | Admitting: Family Medicine

## 2011-10-16 ENCOUNTER — Encounter (HOSPITAL_COMMUNITY): Payer: Self-pay

## 2011-10-16 ENCOUNTER — Encounter (HOSPITAL_COMMUNITY)
Admission: RE | Admit: 2011-10-16 | Discharge: 2011-10-16 | Disposition: A | Discharge status: Home / Self Care | Payer: Medicare Other | Source: Ambulatory Visit | Attending: General Surgery | Admitting: General Surgery

## 2011-10-16 LAB — COMPREHENSIVE METABOLIC PANEL WITH GFR
ALT: 15 U/L (ref 0–35)
AST: 21 U/L (ref 0–37)
Albumin: 3.5 g/dL (ref 3.5–5.2)
Alkaline Phosphatase: 74 U/L (ref 39–117)
BUN: 11 mg/dL (ref 6–23)
CO2: 26 meq/L (ref 19–32)
Calcium: 9.6 mg/dL (ref 8.4–10.5)
Chloride: 106 meq/L (ref 96–112)
Creatinine, Ser: 0.93 mg/dL (ref 0.50–1.10)
GFR calc Af Amer: 69 mL/min — ABNORMAL LOW
GFR calc non Af Amer: 60 mL/min — ABNORMAL LOW
Glucose, Bld: 83 mg/dL (ref 70–99)
Potassium: 4.2 meq/L (ref 3.5–5.1)
Sodium: 140 meq/L (ref 135–145)
Total Bilirubin: 0.3 mg/dL (ref 0.3–1.2)
Total Protein: 6.7 g/dL (ref 6.0–8.3)

## 2011-10-16 LAB — DIFFERENTIAL
Basophils Relative: 1 % (ref 0–1)
Eosinophils Absolute: 0.4 10*3/uL (ref 0.0–0.7)
Lymphs Abs: 1.9 10*3/uL (ref 0.7–4.0)
Monocytes Absolute: 0.5 10*3/uL (ref 0.1–1.0)
Monocytes Relative: 6 % (ref 3–12)
Neutro Abs: 4.9 10*3/uL (ref 1.7–7.7)

## 2011-10-16 LAB — CBC
HCT: 38.8 % (ref 36.0–46.0)
Hemoglobin: 12.7 g/dL (ref 12.0–15.0)
MCH: 29.4 pg (ref 26.0–34.0)
MCHC: 32.7 g/dL (ref 30.0–36.0)
MCV: 89.8 fL (ref 78.0–100.0)
Platelets: 314 K/uL (ref 150–400)
RBC: 4.32 MIL/uL (ref 3.87–5.11)
RDW: 13.4 % (ref 11.5–15.5)
WBC: 7.8 K/uL (ref 4.0–10.5)

## 2011-10-16 LAB — SURGICAL PCR SCREEN
MRSA, PCR: NEGATIVE
Staphylococcus aureus: NEGATIVE

## 2011-10-16 LAB — PROTIME-INR: INR: 0.94 (ref 0.00–1.49)

## 2011-10-16 NOTE — Pre-Procedure Instructions (Signed)
18 Robin Ellis  10/16/2011   Your procedure is scheduled on:  Oct 20, 2011  Report to [facility] at 0715 AM.  Call this number if you have problems the morning of surgery: [phone]   Remember:   Do not eat food:After Midnight.  May have clear liquids: up to 4 Hours before arrival.  Clear liquids include soda, tea, black coffee, apple or grape juice, broth.  Take these medicines the morning of surgery with A SIP OF WATER: protonix,    Do not wear jewelry, make-up or nail polish.  Do not wear lotions, powders, or perfumes. You may wear deodorant.  Do not shave 48 hours prior to surgery.  Do not bring valuables to the hospital.  Contacts, dentures or bridgework may not be worn into surgery.  Leave suitcase in the car. After surgery it may be brought to your room.  For patients admitted to the hospital, checkout time is 11:00 AM the day of discharge.   Patients discharged the day of surgery will not be allowed to drive home.  Name and phone number of your driver: Brett Canales [phone]  Special Instructions: CHG Shower Use Special Wash: 1/2 bottle night before surgery and 1/2 bottle morning of surgery.   Please read over the following fact sheets that you were given: Pain Booklet and Surgical Site Infection Prevention

## 2011-10-19 MED ORDER — CIPROFLOXACIN IN D5W 400 MG/200ML IV SOLN
400.0000 mg | INTRAVENOUS | Status: AC
  Administered 2011-10-20: 400 mg via INTRAVENOUS
  Filled 2011-10-19: qty 200

## 2011-10-20 ENCOUNTER — Encounter (HOSPITAL_COMMUNITY)
Admission: RE | Disposition: A | Discharge status: Home / Self Care | Payer: Self-pay | Source: Ambulatory Visit | Attending: General Surgery

## 2011-10-20 ENCOUNTER — Encounter (HOSPITAL_COMMUNITY): Payer: Self-pay | Admitting: Anesthesiology

## 2011-10-20 ENCOUNTER — Other Ambulatory Visit (INDEPENDENT_AMBULATORY_CARE_PROVIDER_SITE_OTHER): Payer: Self-pay | Admitting: General Surgery

## 2011-10-20 ENCOUNTER — Ambulatory Visit (HOSPITAL_COMMUNITY)
Admission: RE | Admit: 2011-10-20 | Discharge: 2011-10-20 | Disposition: A | Discharge status: Home / Self Care | Payer: Medicare Other | Source: Ambulatory Visit | Attending: General Surgery | Admitting: General Surgery

## 2011-10-20 ENCOUNTER — Ambulatory Visit (HOSPITAL_COMMUNITY): Payer: Medicare Other | Admitting: Anesthesiology

## 2011-10-20 ENCOUNTER — Ambulatory Visit (HOSPITAL_COMMUNITY): Payer: Medicare Other

## 2011-10-20 DIAGNOSIS — K8010 Calculus of gallbladder with chronic cholecystitis without obstruction: Secondary | ICD-10-CM

## 2011-10-20 DIAGNOSIS — M810 Age-related osteoporosis without current pathological fracture: Secondary | ICD-10-CM | POA: Insufficient documentation

## 2011-10-20 DIAGNOSIS — K859 Acute pancreatitis without necrosis or infection, unspecified: Secondary | ICD-10-CM | POA: Insufficient documentation

## 2011-10-20 DIAGNOSIS — K219 Gastro-esophageal reflux disease without esophagitis: Secondary | ICD-10-CM | POA: Insufficient documentation

## 2011-10-20 DIAGNOSIS — Z01812 Encounter for preprocedural laboratory examination: Secondary | ICD-10-CM | POA: Insufficient documentation

## 2011-10-20 DIAGNOSIS — K8020 Calculus of gallbladder without cholecystitis without obstruction: Secondary | ICD-10-CM

## 2011-10-20 DIAGNOSIS — R51 Headache: Secondary | ICD-10-CM | POA: Insufficient documentation

## 2011-10-20 DIAGNOSIS — H269 Unspecified cataract: Secondary | ICD-10-CM | POA: Insufficient documentation

## 2011-10-20 HISTORY — PX: CHOLECYSTECTOMY: SHX55

## 2011-10-20 SURGERY — LAPAROSCOPIC CHOLECYSTECTOMY WITH INTRAOPERATIVE CHOLANGIOGRAM
Anesthesia: General | Wound class: Clean

## 2011-10-20 MED ORDER — ACETAMINOPHEN 10 MG/ML IV SOLN
INTRAVENOUS | Status: AC
  Filled 2011-10-20: qty 100

## 2011-10-20 MED ORDER — OXYCODONE-ACETAMINOPHEN 5-325 MG PO TABS: 1.0000 | ORAL_TABLET | ORAL | Status: DC | PRN

## 2011-10-20 MED ORDER — IOHEXOL 300 MG/ML  SOLN
INTRAMUSCULAR | Status: DC | PRN
  Administered 2011-10-20: 5 mL

## 2011-10-20 MED ORDER — PROPOFOL 10 MG/ML IV EMUL
INTRAVENOUS | Status: DC | PRN
  Administered 2011-10-20: 110 mg via INTRAVENOUS

## 2011-10-20 MED ORDER — BUPIVACAINE-EPINEPHRINE 0.25% -1:200000 IJ SOLN
INTRAMUSCULAR | Status: DC | PRN
  Administered 2011-10-20: 13 mL

## 2011-10-20 MED ORDER — PROMETHAZINE HCL 25 MG/ML IJ SOLN: 6.2500 mg | INTRAMUSCULAR | Status: DC | PRN

## 2011-10-20 MED ORDER — DROPERIDOL 2.5 MG/ML IJ SOLN
INTRAMUSCULAR | Status: DC | PRN
  Administered 2011-10-20: 0.625 mg via INTRAVENOUS

## 2011-10-20 MED ORDER — FENTANYL CITRATE 0.05 MG/ML IJ SOLN
INTRAMUSCULAR | Status: DC | PRN
  Administered 2011-10-20 (×3): 50 ug via INTRAVENOUS

## 2011-10-20 MED ORDER — LACTATED RINGERS IV SOLN
INTRAVENOUS | Status: DC | PRN
  Administered 2011-10-20 (×2): via INTRAVENOUS

## 2011-10-20 MED ORDER — TRAMADOL HCL 50 MG PO TABS: 50.0000 mg | ORAL_TABLET | Freq: Four times a day (QID) | ORAL | Status: DC

## 2011-10-20 MED ORDER — ROCURONIUM BROMIDE 100 MG/10ML IV SOLN
INTRAVENOUS | Status: DC | PRN
  Administered 2011-10-20: 30 mg via INTRAVENOUS

## 2011-10-20 MED ORDER — ONDANSETRON HCL 4 MG/2ML IJ SOLN: 4.0000 mg | Freq: Four times a day (QID) | INTRAMUSCULAR | Status: DC | PRN

## 2011-10-20 MED ORDER — FENTANYL CITRATE 0.05 MG/ML IJ SOLN: 25.0000 ug | INTRAMUSCULAR | Status: DC | PRN

## 2011-10-20 MED ORDER — SODIUM CHLORIDE 0.9 % IR SOLN
Status: DC | PRN
  Administered 2011-10-20: 1000 mL

## 2011-10-20 MED ORDER — CIPROFLOXACIN IN D5W 400 MG/200ML IV SOLN
INTRAVENOUS | Status: AC
  Filled 2011-10-20: qty 200

## 2011-10-20 MED ORDER — ACETAMINOPHEN 10 MG/ML IV SOLN
INTRAVENOUS | Status: DC | PRN
  Administered 2011-10-20: 1000 mg via INTRAVENOUS

## 2011-10-20 MED ORDER — ACETAMINOPHEN 325 MG PO TABS: 650.0000 mg | ORAL_TABLET | ORAL | Status: DC | PRN

## 2011-10-20 MED ORDER — HYDROMORPHONE HCL PF 1 MG/ML IJ SOLN: 0.2500 mg | INTRAMUSCULAR | Status: DC | PRN

## 2011-10-20 MED ORDER — PHENYLEPHRINE HCL 10 MG/ML IJ SOLN
INTRAMUSCULAR | Status: DC | PRN
  Administered 2011-10-20: 40 ug via INTRAVENOUS

## 2011-10-20 MED ORDER — GLYCOPYRROLATE 0.2 MG/ML IJ SOLN
INTRAMUSCULAR | Status: DC | PRN
  Administered 2011-10-20: .6 mg via INTRAVENOUS

## 2011-10-20 MED ORDER — PROMETHAZINE HCL 25 MG/ML IJ SOLN: 12.5000 mg | Freq: Four times a day (QID) | INTRAMUSCULAR | Status: DC | PRN

## 2011-10-20 MED ORDER — DEXAMETHASONE SODIUM PHOSPHATE 10 MG/ML IJ SOLN
INTRAMUSCULAR | Status: DC | PRN
  Administered 2011-10-20: 8 mg via INTRAVENOUS

## 2011-10-20 MED ORDER — DEXTROSE IN LACTATED RINGERS 5 % IV SOLN: INTRAVENOUS | Status: DC

## 2011-10-20 MED ORDER — ACETAMINOPHEN 650 MG RE SUPP: 650.0000 mg | RECTAL | Status: DC | PRN

## 2011-10-20 MED ORDER — ONDANSETRON HCL 4 MG/2ML IJ SOLN
INTRAMUSCULAR | Status: DC | PRN
  Administered 2011-10-20: 4 mg via INTRAVENOUS

## 2011-10-20 MED ORDER — NEOSTIGMINE METHYLSULFATE 1 MG/ML IJ SOLN
INTRAMUSCULAR | Status: DC | PRN
  Administered 2011-10-20: 5 mg via INTRAVENOUS

## 2011-10-20 MED ORDER — MEPERIDINE HCL 25 MG/ML IJ SOLN: 6.2500 mg | INTRAMUSCULAR | Status: DC | PRN

## 2011-10-20 MED ORDER — EPHEDRINE SULFATE 50 MG/ML IJ SOLN
INTRAMUSCULAR | Status: DC | PRN
  Administered 2011-10-20 (×3): 5 mg via INTRAVENOUS

## 2011-10-20 SURGICAL SUPPLY — 47 items
APL SKNCLS STERI-STRIP NONHPOA (GAUZE/BANDAGES/DRESSINGS) ×1
APPLIER CLIP 5 13 M/L LIGAMAX5 (MISCELLANEOUS) ×2
APR CLP MED LRG 5 ANG JAW (MISCELLANEOUS) ×1
BAG SPEC RTRVL LRG 6X4 10 (ENDOMECHANICALS) ×1
BENZOIN TINCTURE PRP APPL 2/3 (GAUZE/BANDAGES/DRESSINGS) ×2 IMPLANT
CANISTER SUCTION 2500CC (MISCELLANEOUS) ×2 IMPLANT
CHLORAPREP W/TINT 26ML (MISCELLANEOUS) ×2 IMPLANT
CLIP APPLIE 5 13 M/L LIGAMAX5 (MISCELLANEOUS) ×1 IMPLANT
CLOSURE STERI STRIP 1/2 X4 (GAUZE/BANDAGES/DRESSINGS) ×1 IMPLANT
CLOTH BEACON ORANGE TIMEOUT ST (SAFETY) ×2 IMPLANT
COVER MAYO STAND STRL (DRAPES) ×2 IMPLANT
COVER SURGICAL LIGHT HANDLE (MISCELLANEOUS) ×2 IMPLANT
DECANTER SPIKE VIAL GLASS SM (MISCELLANEOUS) ×4 IMPLANT
DRAPE C-ARM 42X72 X-RAY (DRAPES) ×2 IMPLANT
DRAPE UTILITY 15X26 W/TAPE STR (DRAPE) ×4 IMPLANT
DRSG TEGADERM 2-3/8X2-3/4 SM (GAUZE/BANDAGES/DRESSINGS) ×1 IMPLANT
ELECT REM PT RETURN 9FT ADLT (ELECTROSURGICAL) ×2
ELECTRODE REM PT RTRN 9FT ADLT (ELECTROSURGICAL) ×1 IMPLANT
GAUZE SPONGE 2X2 8PLY STRL LF (GAUZE/BANDAGES/DRESSINGS) ×1 IMPLANT
GLOVE BIOGEL PI IND STRL 7.5 (GLOVE) ×1 IMPLANT
GLOVE BIOGEL PI IND STRL 8 (GLOVE) ×1 IMPLANT
GLOVE BIOGEL PI INDICATOR 7.5 (GLOVE) ×1
GLOVE BIOGEL PI INDICATOR 8 (GLOVE) ×1
GLOVE ECLIPSE 6.5 STRL STRAW (GLOVE) ×2 IMPLANT
GLOVE ECLIPSE 7.5 STRL STRAW (GLOVE) ×2 IMPLANT
GLOVE ECLIPSE 8.0 STRL XLNG CF (GLOVE) ×2 IMPLANT
GOWN PREVENTION PLUS XLARGE (GOWN DISPOSABLE) ×2 IMPLANT
GOWN STRL NON-REIN LRG LVL3 (GOWN DISPOSABLE) ×4 IMPLANT
KIT BASIN OR (CUSTOM PROCEDURE TRAY) ×2 IMPLANT
KIT ROOM TURNOVER OR (KITS) ×2 IMPLANT
NS IRRIG 1000ML POUR BTL (IV SOLUTION) ×2 IMPLANT
PAD ARMBOARD 7.5X6 YLW CONV (MISCELLANEOUS) ×4 IMPLANT
POUCH SPECIMEN RETRIEVAL 10MM (ENDOMECHANICALS) ×2 IMPLANT
SCISSORS LAP 5X35 DISP (ENDOMECHANICALS) IMPLANT
SET CHOLANGIOGRAPH 5 50 .035 (SET/KITS/TRAYS/PACK) ×2 IMPLANT
SET IRRIG TUBING LAPAROSCOPIC (IRRIGATION / IRRIGATOR) ×2 IMPLANT
SLEEVE ENDOPATH XCEL 5M (ENDOMECHANICALS) ×4 IMPLANT
SPECIMEN JAR SMALL (MISCELLANEOUS) ×2 IMPLANT
SPONGE GAUZE 2X2 STER 10/PKG (GAUZE/BANDAGES/DRESSINGS) ×1
SUT MON AB 4-0 PC3 18 (SUTURE) ×2 IMPLANT
TOWEL OR 17X24 6PK STRL BLUE (TOWEL DISPOSABLE) ×2 IMPLANT
TOWEL OR 17X26 10 PK STRL BLUE (TOWEL DISPOSABLE) ×2 IMPLANT
TRAY LAPAROSCOPIC (CUSTOM PROCEDURE TRAY) ×2 IMPLANT
TROCAR XCEL BLUNT TIP 100MML (ENDOMECHANICALS) ×2 IMPLANT
TROCAR XCEL NON-BLD 11X100MML (ENDOMECHANICALS) IMPLANT
TROCAR XCEL NON-BLD 5MMX100MML (ENDOMECHANICALS) ×2 IMPLANT
WATER STERILE IRR 1000ML POUR (IV SOLUTION) IMPLANT

## 2011-10-20 NOTE — Anesthesia Preprocedure Evaluation (Addendum)
Anesthesia Evaluation  Patient identified by MRN, date of birth, ID band Patient awake    Reviewed: Allergy & Precautions, H&P , NPO status , Patient's Chart, lab work & pertinent test results  Airway Mallampati: II TM Distance: >3 FB Neck ROM: Full    Dental  (+) Teeth Intact and Dental Advisory Given   Pulmonary pneumonia  (walking pneumonia 2009),  clear to auscultation        Cardiovascular Regular Normal    Neuro/Psych  Headaches,  Neuromuscular disease    GI/Hepatic GERD-  Medicated and Controlled,  Endo/Other    Renal/GU      Musculoskeletal   Abdominal   Peds  Hematology   Anesthesia Other Findings   Reproductive/Obstetrics                           Anesthesia Physical Anesthesia Plan  ASA: II  Anesthesia Plan: General   Post-op Pain Management:    Induction: Intravenous  Airway Management Planned: Oral ETT  Additional Equipment:   Intra-op Plan:   Post-operative Plan: Extubation in OR  Informed Consent: I have reviewed the patients History and Physical, chart, labs and discussed the procedure including the risks, benefits and alternatives for the proposed anesthesia with the patient or authorized representative who has indicated his/her understanding and acceptance.     Plan Discussed with: CRNA  Anesthesia Plan Comments:         Anesthesia Quick Evaluation

## 2011-10-20 NOTE — Interval H&P Note (Signed)
History and Physical Interval Note:  10/20/2011 8:45 AM  Robin Ellis  has presented today for surgery, with the diagnosis of Cholelithiasis  The various methods of treatment have been discussed with the patient and family. After consideration of risks, benefits and other options for treatment, the patient has consented to  Procedure(s): LAPAROSCOPIC CHOLECYSTECTOMY WITH INTRAOPERATIVE CHOLANGIOGRAM as a surgical intervention .  The patients' history has been reviewed, patient examined, no change in status, stable for surgery.  I have reviewed the patients' chart and labs.  Questions were answered to the patient's satisfaction.     Addie Alonge Shela Commons

## 2011-10-20 NOTE — Transfer of Care (Signed)
Immediate Anesthesia Transfer of Care Note  Patient: Robin Ellis  Procedure(s) Performed:  LAPAROSCOPIC CHOLECYSTECTOMY WITH INTRAOPERATIVE CHOLANGIOGRAM  Patient Location: PACU  Anesthesia Type: General  Level of Consciousness: awake  Airway & Oxygen Therapy: Patient Spontanous Breathing and Patient connected to nasal cannula oxygen  Post-op Assessment: Report given to PACU RN and Post -op Vital signs reviewed and stable  Post vital signs: stable  Complications: No apparent anesthesia complications

## 2011-10-20 NOTE — Anesthesia Postprocedure Evaluation (Signed)
  Anesthesia Post-op Note  Patient: Robin Ellis  Procedure(s) Performed:  LAPAROSCOPIC CHOLECYSTECTOMY WITH INTRAOPERATIVE CHOLANGIOGRAM  Patient Location: PACU  Anesthesia Type: General  Level of Consciousness: awake  Airway and Oxygen Therapy: Patient Spontanous Breathing and Patient connected to nasal cannula oxygen  Post-op Pain: none  Post-op Assessment: Post-op Vital signs reviewed, Patient's Cardiovascular Status Stable, Respiratory Function Stable and Patent Airway  Post-op Vital Signs: Reviewed and stable  Complications: No apparent anesthesia complications

## 2011-10-20 NOTE — H&P (View-Only) (Signed)
Patient ID: Robin Ellis, female   DOB: 09/08/1941, 71 y.o.   MRN: [id number]  Chief Complaint  Patient presents with  . New Evaluation    eval of GB     HPI Robin Ellis is a 71 y.o. female.  HPI    She is referred by Dr. Campos from the emergency department for evaluation of symptomatic cholelithiasis.  She had a fatty meal back in mid November then presented with right upper quadrant pain and nausea. She went to the emergency department and was noted to have normal liver function tests are slightly elevated lipase level and a white blood cell count of 15,000. However, while in the emergency department she became pain-free. An ultrasound demonstrated small stones and sludge in the gallbladder. No signs of acute cholecystitis. She was discharged on low-fat diet was doing well until she ate some fast food chicken that had a limited episode of biliary colic. She is here for further evaluation and treatment for gallbladder disease. Past Medical History  Diagnosis Date  . Osteoporosis   . Urinary incontinence   . Migraines   . Lymphocytic colitis   . GERD (gastroesophageal reflux disease)     esophageal stricture  . IBS (irritable bowel syndrome)   . Cataract     Past Surgical History  Procedure Date  . Shoulder surgery 10/2006    left   . Vaginal hysterectomy     no oopherectomy    Family History  Problem Relation Age of Onset  . Cancer Mother     myeloma, multiple    Social History History  Substance Use Topics  . Smoking status: Former Smoker -- 1.0 packs/day for 10 years    Types: Cigarettes    Quit date: 01/22/1991  . Smokeless tobacco: Former User    Quit date: 01/15/1991  . Alcohol Use: No    Allergies  Allergen Reactions  . Cefdinir     REACTION: diarrhea  . Codeine     REACTION: nausea  . Hydrocodone-Acetaminophen     REACTION: nausea    Current Outpatient Prescriptions  Medication Sig Dispense Refill  . amitriptyline (ELAVIL) 25 MG tablet  TAKE 1 TABLET BY MOUTH AT BEDTIME  30 tablet  1  . Ascorbic Acid (VITAMIN C) 500 MG tablet Take 500 mg by mouth daily.        . aspirin-acetaminophen-caffeine ([facility] MIGRAINE RELIEF) 250-250-65 MG per tablet Take 1 tablet by mouth every 6 (six) hours as needed.        . Calcium Carbonate-Vit D-Min (CALCIUM 1200) 1200-1000 MG-UNIT CHEW Chew 1 tablet by mouth 2 (two) times daily.        . cholecalciferol (VITAMIN D) 1000 UNITS tablet Take 1,000 Units by mouth daily.        . estradiol (ESTRACE) 0.5 MG tablet Take 1 tablet (0.5 mg total) by mouth daily.  30 tablet  5  . fish oil-omega-3 fatty acids 1000 MG capsule Take 1 g by mouth daily.        . hyoscyamine (LEVSIN SL) 0.125 MG SL tablet Place 0.125 mg under the tongue every 6 (six) hours as needed.        . mometasone (NASONEX) 50 MCG/ACT nasal spray 2 sprays by Nasal route daily.        . pantoprazole (PROTONIX) 40 MG tablet TAKE 1 TABLET BY MOUTH ONCE DAILY 30-60 MINUTES PRIOR TO BREAKFAST  30 tablet  5  . Probiotic Product (ALIGN PO) Take 1 tablet by   mouth daily.        . simvastatin (ZOCOR) 10 MG tablet TAKE 1 TABLET BY MOUTH AT BEDTIME  30 tablet  6  . tetrahydrozoline (VISINE) 0.05 % ophthalmic solution Place 2 drops into both eyes 2 (two) times daily. As needed for dryness of eyes       . zonisamide (ZONEGRAN) 100 MG capsule Take 200 mg by mouth daily.         Review of Systems Review of Systems  Constitutional: Negative for fever, chills and unexpected weight change.  HENT: Positive for rhinorrhea.   Eyes: Positive for visual disturbance (cataracts).  Respiratory: Negative.   Cardiovascular: Negative.   Gastrointestinal:       As per HPI  Genitourinary: Negative for dysuria and frequency.  Neurological: Positive for headaches.  Hematological: Negative.     Blood pressure 124/86, pulse 60, temperature 97 F (36.1 C), temperature source Temporal, resp. rate 16, height 5' 2" (1.575 m), weight 122 lb 6 oz (55.509 kg).  Physical  Exam Physical Exam  Constitutional: She appears well-developed and well-nourished. No distress.  HENT:  Head: Normocephalic and atraumatic.  Eyes: Conjunctivae and EOM are normal.  Neck: Neck supple.  Cardiovascular: Normal rate and regular rhythm.   Pulmonary/Chest: Effort normal and breath sounds normal.  Abdominal: Soft. She exhibits no distension and no mass. There is no tenderness.  Musculoskeletal: Normal range of motion. She exhibits no edema.  Lymphadenopathy:    She has no cervical adenopathy.  Skin: Skin is warm and dry.  Psychiatric: She has a normal mood and affect. Her behavior is normal.    Data Reviewed  Notes and results from her emergency department visit.  Assessment    Symptomatic cholelithiasis without evidence of cholecystitis    Plan   Strict lowfat diet.  Laparoscopic cholecystectomy.  I have explained the procedure, risks, and aftercare of cholecystectomy.  Risks include but are not limited to bleeding, infection, wound problems, anesthesia, diarrhea, bile leak, injury to common bile duct/liver/intestine.  She seems to understand and agrees to proceed.       Robin Ellis J 09/23/2011, 12:50 PM    

## 2011-10-20 NOTE — Preoperative (Signed)
Beta Blockers   Reason not to administer Beta Blockers:Not Applicable 

## 2011-10-20 NOTE — Op Note (Signed)
Preoperative diagnosis:  Biliary pancreatitis  Postoperative diagnosis:  Same  Procedure: Laparoscopic cholecystectomy with cholangiogram.  Surgeon: Avel Peace, M.D.  Asst.:  Claud Kelp, M.D.  Anesthesia: General  Indication:    Robin Ellis Is a 71 year old female who was seen in the emergency department with upper abdominal pain. She was found to have gallstones as well as a slight elevation of her lipase. She was given pain medicine and medicine for nausea she rapidly improved and was able to be discharged from the emergency department. I saw her as an outpatient and now she presents for elective laparoscopic cholecystectomy.  Technique: The patient was brought to the operating room, placed supine on the operating table, and a general anesthetic was administered. The hair on the abdominal wall was clipped as was necessary. The abdominal wall was then sterilely prepped and draped. Local anesthetic (Marcaine) was infiltrated in the subumbilical region. A small subumbilical incision was made through the skin, subcutaneous tissue, fascia, and peritoneum entering the peritoneal cavity under direct vision. A pursestring suture of 0 Vicryl was placed around the edges of the fascia. A Hassan trocar was introduced into the peritoneal cavity and a pneumoperitoneum was created by insufflation of carbon dioxide gas. The laparoscope was introduced into the trocar and no underlying bleeding or organ injury was noted. The patient was then placed in the reverse Trendelenburg position with the right side tilted slightly up.  Three more trochars were then placed into the abdominal cavity under laparoscopic vision. One in the epigastric area, and 2 in the right upper quadrant area. The gallbladder was visualized and thin adhesions between the gallbladder and duodenum were separated bluntly.  The fundus was grasped and retracted toward the right shoulder.  The infundibulum was mobilized with dissection close  to the gallbladder and retracted laterally. The cystic duct was identified and a window was created around it. The cystic artery was also identified and a window was created around it. The critical view was achieved. A clip was placed at the neck of the gallbladder. A small incision was made in the cystic duct and 2 small pigmented gallstones were removed from the cystic duct.. A cholangiocatheter was introduced through the anterior abdominal wall and placed in the cystic duct. A intraoperative cholangiogram was then performed.  Under real-time fluoroscopy, dilute contrast was injected into the cystic duct.  The common hepatic duct, the right and left hepatic ducts, and the common duct were all visualized. Contrast drained into the duodenum without obvious evidence of any obstructing ductal lesion. The final report is pending the Radiologist's interpretation.  The cholangiocatheter was removed, the cystic duct was clipped 3 times on the biliary side, and then the cystic duct was divided sharply. No bile leak was noted from the cystic duct stump.  The cystic artery was then clipped and divided. Following this the gallbladder was dissected free from the liver using electrocautery. The gallbladder was then placed in a retrieval bag and removed from the abdominal cavity through the subumbilical incision.  The gallbladder fossa was inspected, irrigated, and bleeding was controlled with electrocautery. Inspection showed that hemostasis was adequate and there was no evidence of bile leak.  The irrigation fluid was evacuated as much as possible.  The subumbilical trocar was removed and the fascial defect was closed by tightening and tying down the pursestring suture under laparoscopic vision.  The remaining trochars were removed and the pneumoperitoneum was released. The skin incisions were closed with 4-0 Monocryl subcuticular stitches. Steri-Strips and sterile  dressings were applied.  The procedure was  well-tolerated without any apparent complications. The patient was taken to the recovery room in satisfactory condition.

## 2011-10-21 ENCOUNTER — Encounter (HOSPITAL_COMMUNITY): Payer: Self-pay | Admitting: General Surgery

## 2011-11-13 ENCOUNTER — Ambulatory Visit (INDEPENDENT_AMBULATORY_CARE_PROVIDER_SITE_OTHER): Payer: Medicare Other | Admitting: General Surgery

## 2011-11-13 ENCOUNTER — Encounter (INDEPENDENT_AMBULATORY_CARE_PROVIDER_SITE_OTHER): Payer: Self-pay | Admitting: General Surgery

## 2011-11-13 VITALS — BP 118/76 | HR 68 | Temp 97.9°F | Resp 16 | Ht 62.5 in | Wt 124.0 lb

## 2011-11-13 DIAGNOSIS — Z9889 Other specified postprocedural states: Secondary | ICD-10-CM

## 2011-11-13 NOTE — Patient Instructions (Signed)
Low-fat diet. Activities as tolerated. Call us if the diarrhea persists for 2 months.

## 2011-11-13 NOTE — Progress Notes (Signed)
She is here for a postop visit following laparoscopic cholecystectomy.  Diet is being tolerated.  Having some problems with diarrhea-2 loose BMs per day.  No problems with incisions.  PE:  ABD:  Soft, incisions clean/dry/intact and solid.  Assessment:  Doing well postop.  Plan:  Lowfat diet recommended.  Activities as tolerated.  Return visit prn.  If the diarrhea persists for 2 months, I asked her to call us and we could prescribe her some Questran.

## 2011-12-14 ENCOUNTER — Other Ambulatory Visit: Payer: Self-pay | Admitting: Family Medicine

## 2012-01-16 ENCOUNTER — Other Ambulatory Visit: Payer: Self-pay | Admitting: Internal Medicine

## 2012-01-18 ENCOUNTER — Other Ambulatory Visit: Payer: Self-pay

## 2012-01-18 NOTE — Telephone Encounter (Signed)
Pt left v/m that Dr Leone Payor had been refill Pantoprazole but Dr Leone Payor wanted Dr Dayton Martes to starting prescribing. When I looked at med list Dr Leone Payor filled electronically on 01/16/12 #30 x 5 refill. I called pt but got v/m and [facility] said they would notify pt ready for pick up by Dr Leone Payor.

## 2012-01-18 NOTE — Telephone Encounter (Signed)
Pt informed that per Dr. Teresita Madura Oct. 2012 note her PCP should refill her Protonix. Pt reported that she will call her PCP about this.

## 2012-01-31 ENCOUNTER — Other Ambulatory Visit: Payer: Self-pay | Admitting: Internal Medicine

## 2012-02-10 ENCOUNTER — Other Ambulatory Visit: Payer: Self-pay | Admitting: Family Medicine

## 2012-02-10 ENCOUNTER — Other Ambulatory Visit: Payer: Self-pay | Admitting: Internal Medicine

## 2012-02-20 IMAGING — US US ABDOMEN COMPLETE
1 series · 14 of 25 positions shown · non-contrast
Comparison: None

CLINICAL DATA: Right upper quadrant pain

ULTRASOUND ABDOMEN:
TECHNIQUE: Sonography of upper abdominal structures was performed.

[Series 1: us abdomen complete · 0.32mm/px · 14 of 93 slices shown]
[im 1/93]
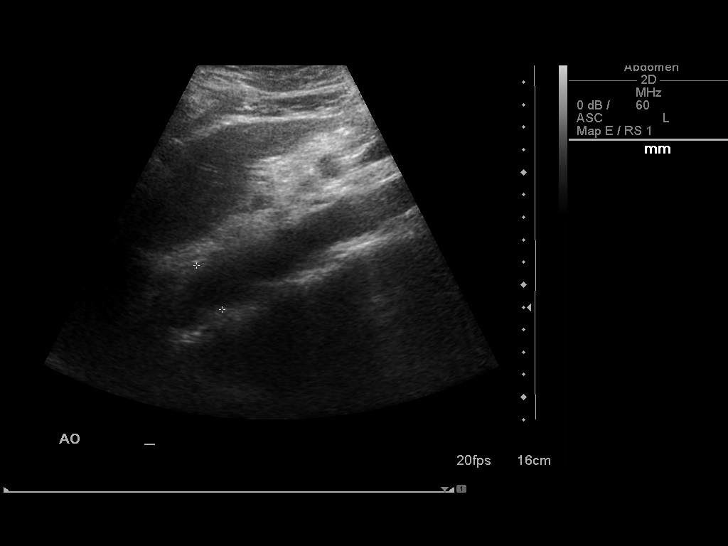
[im 8/93]
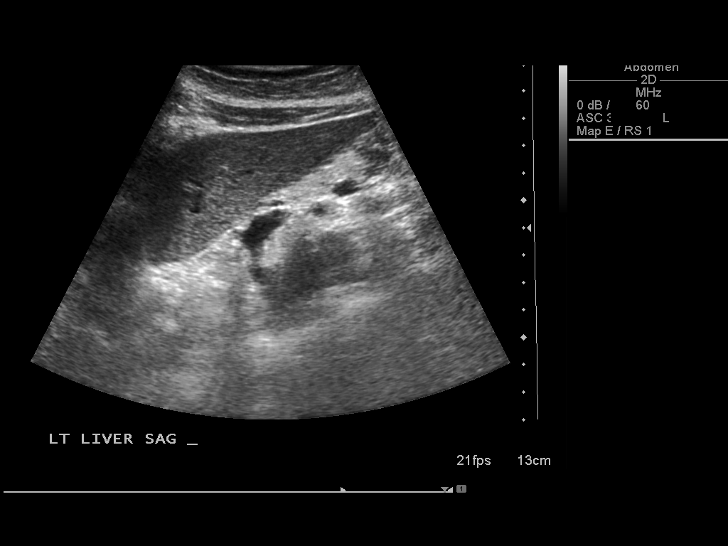
[im 16/93]
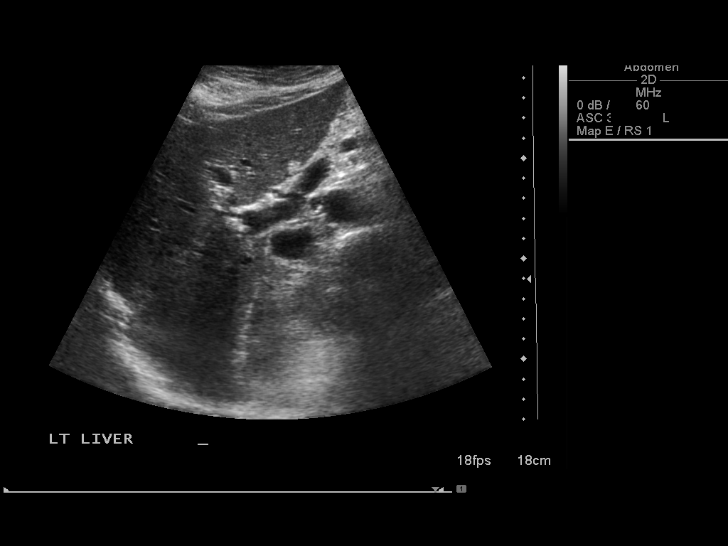
[im 24/93]
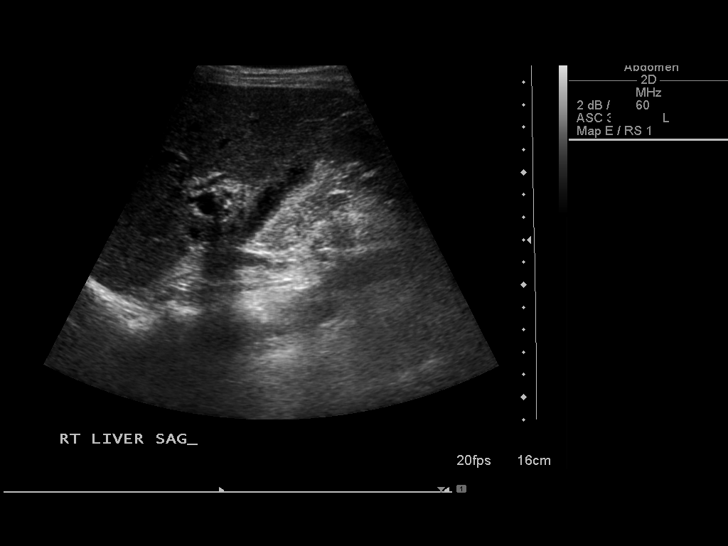
[im 31/93]
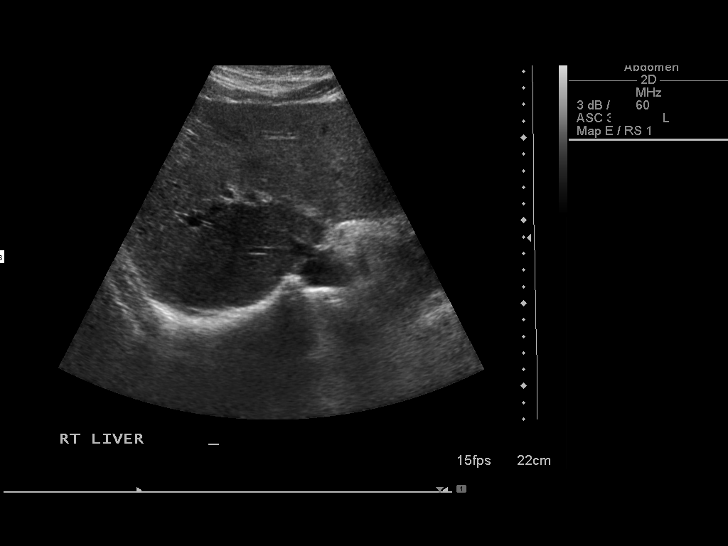
[im 35/93]
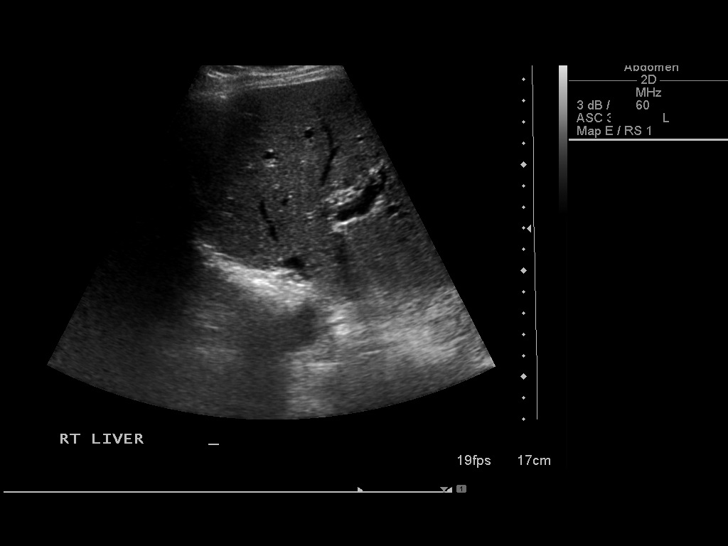
[im 43/93]
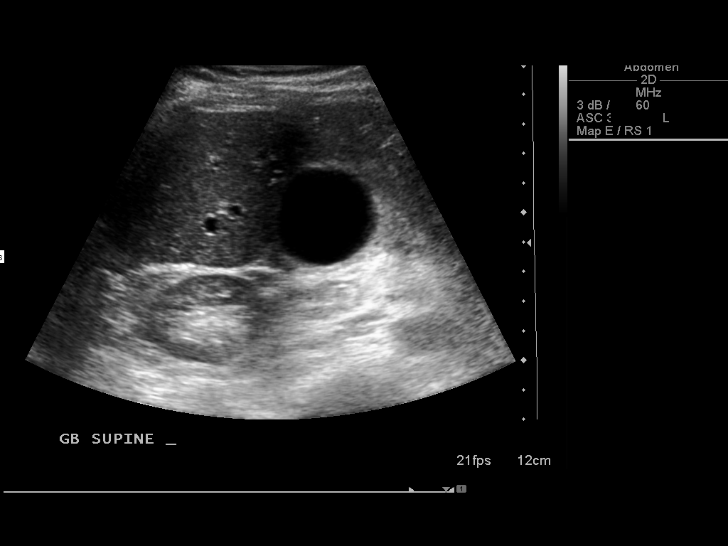
[im 50/93]
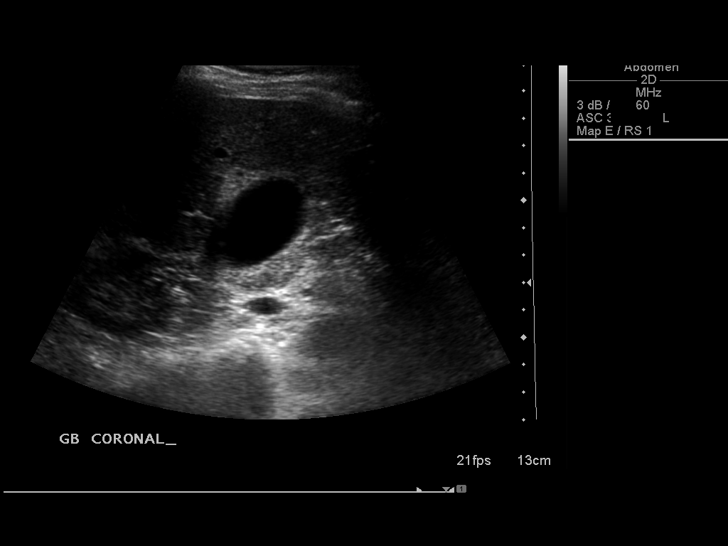
[im 58/93]
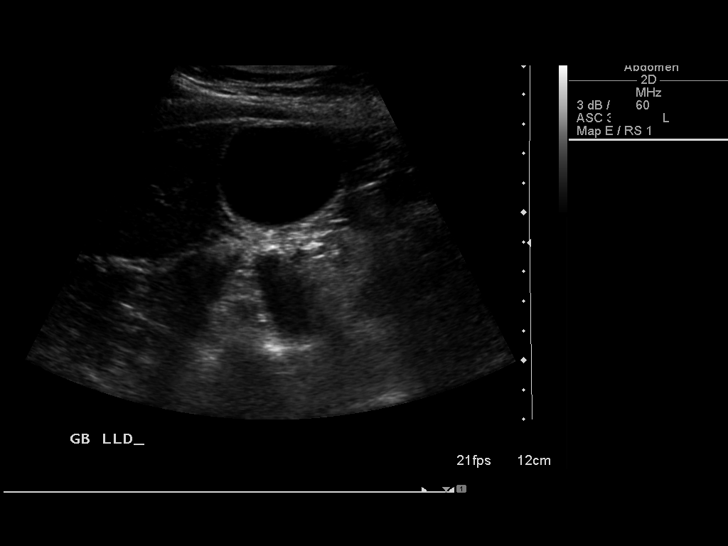
[im 62/93]
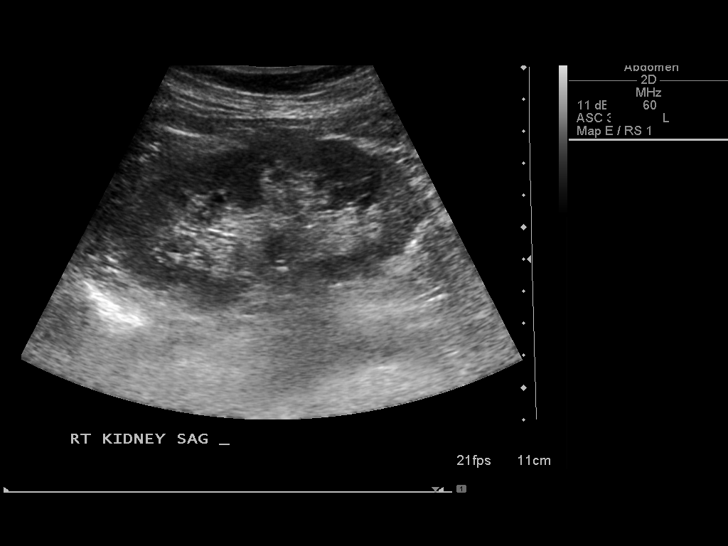
[im 70/93]
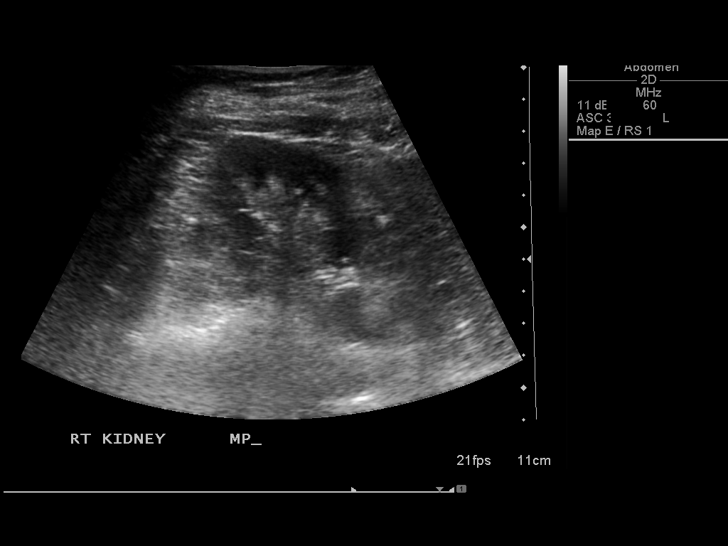
[im 77/93]
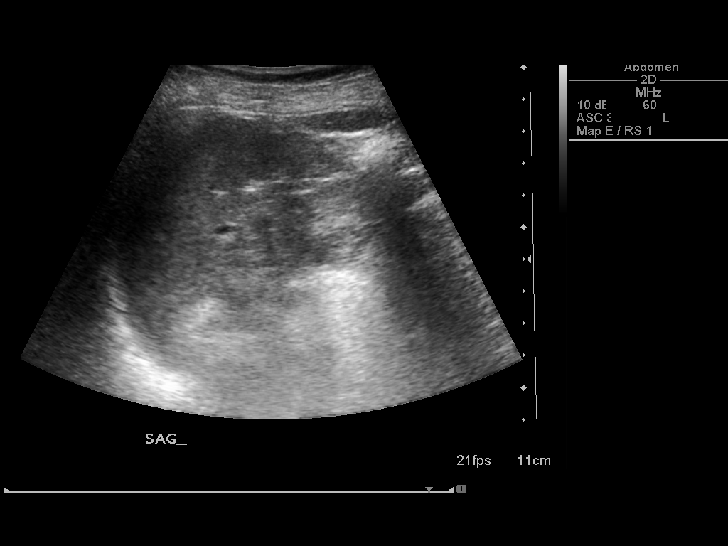
[im 85/93]
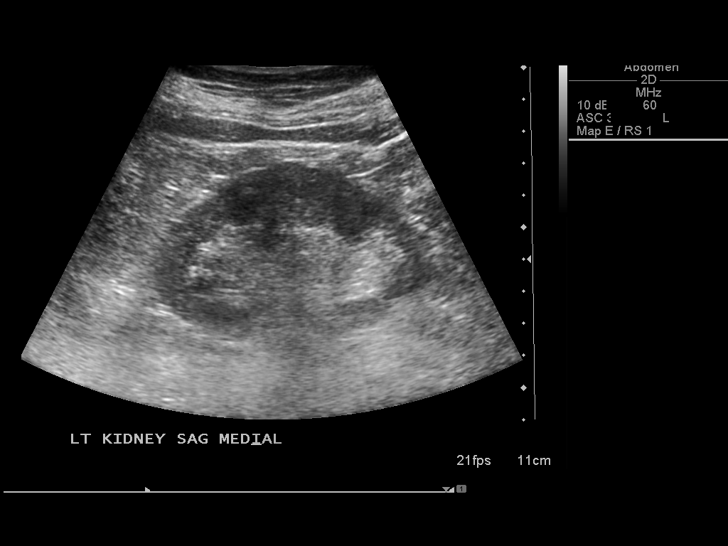
[im 93/93]
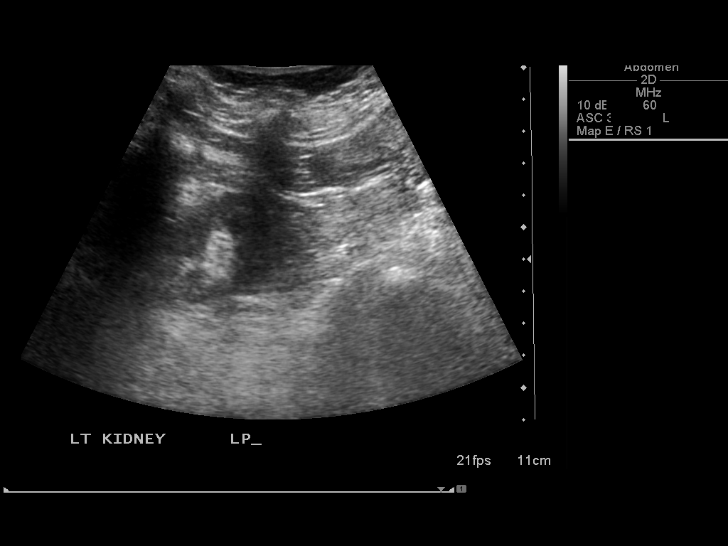

[14 of 25 positions shown; findings below may reference images not displayed]

Gallbladder:  Few small dependent nonshadowing echogenic foci seen
within gallbladder lumen, question nonshadowing calculi versus
sludge.  No gallbladder wall thickening or pericholecystic fluid.
Unable to accurately assess for presence of a sonographic Murphy's
sign due to prior administration of pain medication.

Common bile duct:  Normal caliber for age 6 mm diameter

Liver:  Normal appearance

IVC:  Normal appearance

Pancreas:  Normal appearance

Spleen:  Normal appearance, 7.1 cm length

Right kidney:  10.1 cm length. Normal morphology without mass or
hydronephrosis.

Left kidney:  9.6 cm length. Normal morphology without mass or
hydronephrosis.

Aorta:  Normal caliber

Other:  No free fluid
IMPRESSION: Dependent echogenic nonshadowing foci within gallbladder lumen
question nonshadowing calculi versus sludge.
Otherwise negative exam.

## 2012-02-26 ENCOUNTER — Other Ambulatory Visit: Payer: Self-pay | Admitting: Family Medicine

## 2012-03-03 ENCOUNTER — Ambulatory Visit (INDEPENDENT_AMBULATORY_CARE_PROVIDER_SITE_OTHER): Payer: Medicare Other | Admitting: Cardiovascular Disease

## 2012-03-03 ENCOUNTER — Encounter: Payer: Self-pay | Admitting: Cardiovascular Disease

## 2012-03-03 VITALS — BP 131/76 | HR 75 | Ht 62.0 in | Wt 124.0 lb

## 2012-03-03 DIAGNOSIS — R208 Other disturbances of skin sensation: Secondary | ICD-10-CM | POA: Insufficient documentation

## 2012-03-03 DIAGNOSIS — E785 Hyperlipidemia, unspecified: Secondary | ICD-10-CM

## 2012-03-03 DIAGNOSIS — R238 Other skin changes: Secondary | ICD-10-CM

## 2012-03-03 DIAGNOSIS — R002 Palpitations: Secondary | ICD-10-CM

## 2012-03-03 NOTE — Patient Instructions (Signed)
You are doing well. No medication changes were made.  Please call us if you have new issues that need to be addressed before your next appt.  Your physician wants you to follow-up in: 12 months.  You will receive a reminder letter in the mail two months in advance. If you don't receive a letter, please call our office to schedule the follow-up appointment. 

## 2012-03-03 NOTE — Assessment & Plan Note (Signed)
She has good lower to mid pulses suggesting this is not a vascular issue. She'll likely need to keep her feet warm in the winter as she is doing. Numbness on the bottom of her feet may be neuropathy and might need further workup though she says she has had nerve studies done which were normal per her report.

## 2012-03-03 NOTE — Assessment & Plan Note (Signed)
Cholesterol has improved down to 190 on low-dose simvastatin. This is a good improvement. We have asked her to discuss whether she might tolerate simvastatin 20 mg daily to push her cholesterol even lower.

## 2012-03-03 NOTE — Assessment & Plan Note (Signed)
Rare short episodes. She does not want a pill for symptoms. We could try low-dose bystolic if needed if symptoms get worse.

## 2012-03-03 NOTE — Progress Notes (Signed)
Patient ID: Robin Ellis, female    DOB: 1941-08-22, 71 y.o.   MRN: [id number]  HPI Comments: Robin Ellis with a history of hyperlipidemia, colitis/irritable bowel, patient of Dr. Dayton Martes, who presents for routine followup.  She continues to have rare episodes of palpitations that last for a very short period of time. They are sometimes daily, other times every other day or less. They happen at rest and with exertion. She did have her gallbladder out in January of 2013. She reports she has tolerated a cholesterol pill without difficulty.  Her biggest complaint is numbness on the bottom of her feet. Her feet are also very cold in the winter and painful at times. She has to wear double socks and a warm blanket to keep them warm. If she moves them, or starts walking, discomfort resolves.  EKG today shows normal sinus rhythm with rate 75 beats per minute with no significant ST or T wave changes   Outpatient Encounter Prescriptions as of 03/03/2012  Medication Sig Dispense Refill  . amitriptyline (ELAVIL) 25 MG tablet TAKE 1 TABLET BY MOUTH AT BEDTIME  30 tablet  1  . Ascorbic Acid (VITAMIN C) 500 MG tablet Take 500 mg by mouth daily.        Marland Kitchen aspirin-acetaminophen-caffeine ([facility] MIGRAINE RELIEF) 250-250-65 MG per tablet Take 1 tablet by mouth every 6 (six) hours as needed.        . Calcium Carbonate-Vit D-Min (CALCIUM 1200) 1200-1000 MG-UNIT CHEW Chew 1 tablet by mouth 2 (two) times daily.        . cholecalciferol (VITAMIN D) 1000 UNITS tablet Take 1,000 Units by mouth daily.        Marland Kitchen estradiol (ESTRACE) 0.5 MG tablet Take 0.5 mg by mouth daily.        . fish oil-omega-3 fatty acids 1000 MG capsule Take 1 g by mouth daily.        . hyoscyamine (LEVSIN SL) 0.125 MG SL tablet Place 0.125 mg under the tongue every 6 (six) hours as needed.        . mometasone (NASONEX) 50 MCG/ACT nasal spray 2 sprays by Nasal route daily.        . pantoprazole (PROTONIX) 40 MG tablet  Take 40 mg by mouth daily.        . Probiotic Product (ALIGN PO) Take 1 tablet by mouth daily.        . simvastatin (ZOCOR) 10 MG tablet Take 10 mg by mouth at bedtime.        Marland Kitchen tetrahydrozoline (VISINE) 0.05 % ophthalmic solution Place 2 drops into both eyes 2 (two) times daily. As needed for dryness of eyes          Review of Systems  Constitutional: Negative.   HENT: Negative.   Eyes: Negative.   Respiratory: Negative.   Cardiovascular: Positive for palpitations.       Tachycardia  Gastrointestinal: Negative.   Musculoskeletal: Negative.   Skin: Negative.   Hematological: Negative.   Psychiatric/Behavioral: Negative.   All other systems reviewed and are negative.   BP 131/76  Pulse 75  Ht 5\' 2"  (1.575 m)  Wt 124 lb (56.246 kg)  BMI 22.68 kg/m2  Physical Exam  Nursing note and vitals reviewed. Constitutional: She is oriented to person, place, and time. She appears well-developed and well-nourished.  HENT:  Head: Normocephalic.  Nose: Nose normal.  Mouth/Throat: Oropharynx is clear and moist.  Eyes: Conjunctivae are normal. Pupils  are equal, round, and reactive to light.  Neck: Normal range of motion. Neck supple. No JVD present.  Cardiovascular: Normal rate, regular rhythm, S1 normal, S2 normal, normal heart sounds and intact distal pulses.  Exam reveals no gallop and no friction rub.   No murmur heard. Pulmonary/Chest: Effort normal and breath sounds normal. No respiratory distress. She has no wheezes. She has no rales. She exhibits no tenderness.  Abdominal: Soft. Bowel sounds are normal. She exhibits no distension. There is no tenderness.  Musculoskeletal: Normal range of motion. She exhibits no edema and no tenderness.  Lymphadenopathy:    She has no cervical adenopathy.  Neurological: She is alert and oriented to person, place, and time. Coordination normal.  Skin: Skin is warm and dry. No rash noted. No erythema.  Psychiatric: She has a normal mood and affect.  Her behavior is normal. Judgment and thought content normal.         Assessment and Plan

## 2012-03-22 ENCOUNTER — Telehealth: Payer: Self-pay

## 2012-03-22 MED ORDER — ZOSTER VACCINE LIVE 19400 UNT/0.65ML ~~LOC~~ SOLR: 0.6500 mL | Freq: Once | SUBCUTANEOUS | Status: AC

## 2012-03-22 NOTE — Telephone Encounter (Signed)
Rx sent 

## 2012-03-22 NOTE — Telephone Encounter (Signed)
Pt request shingles vaccine rx sent to [facility].Please advise.

## 2012-03-22 NOTE — Telephone Encounter (Signed)
Left message advising pt that script has been sent to pharmacy.

## 2012-04-06 ENCOUNTER — Other Ambulatory Visit: Payer: Self-pay | Admitting: Family Medicine

## 2012-04-10 IMAGING — RF DG CHOLANGIOGRAM OPERATIVE
1 series · 6 of 6 positions shown · non-contrast
Comparison: None.

CLINICAL DATA: Gallstones

INTRAOPERATIVE CHOLANGIOGRAM
TECHNIQUE: Cholangiographic images from the C-arm fluoroscopic
device were submitted for interpretation post-operatively.  Please
see the procedural report for the amount of contrast and the
fluoroscopy time utilized.

[Series 1: run · 3 acquisitions, 6 frames shown]
[im 1/3]
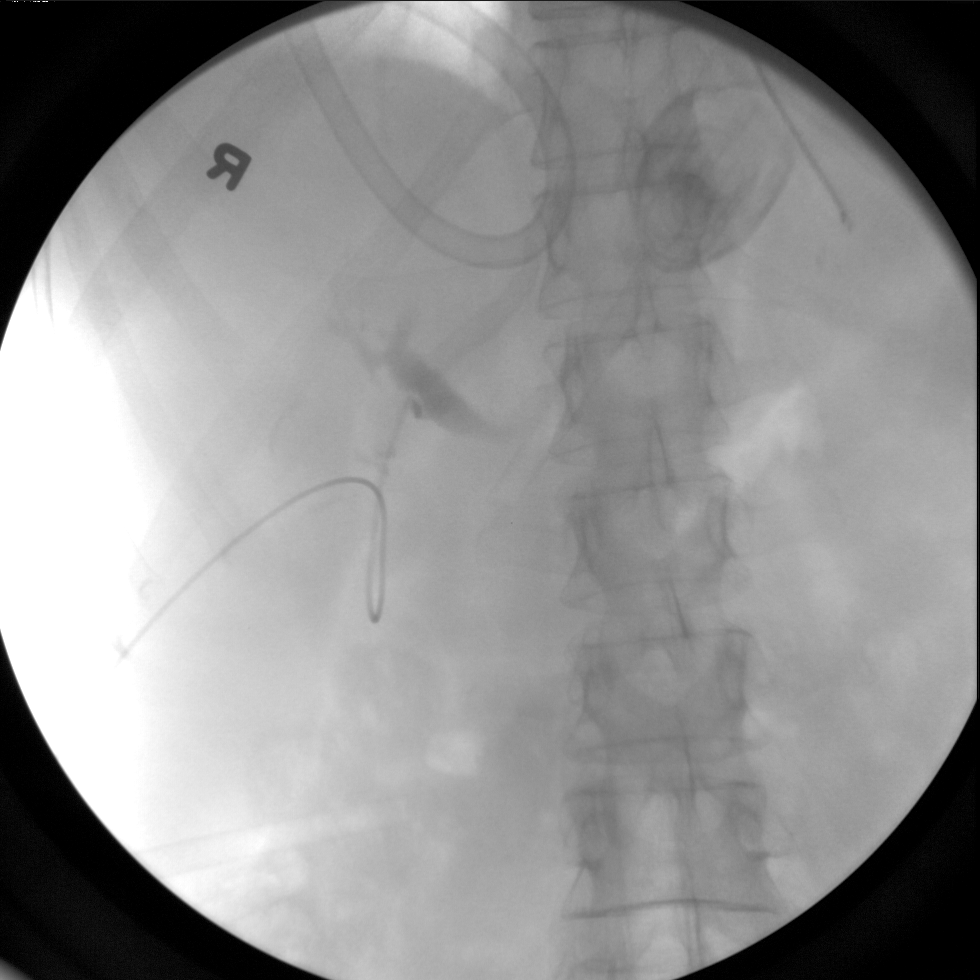
[im 1/3]
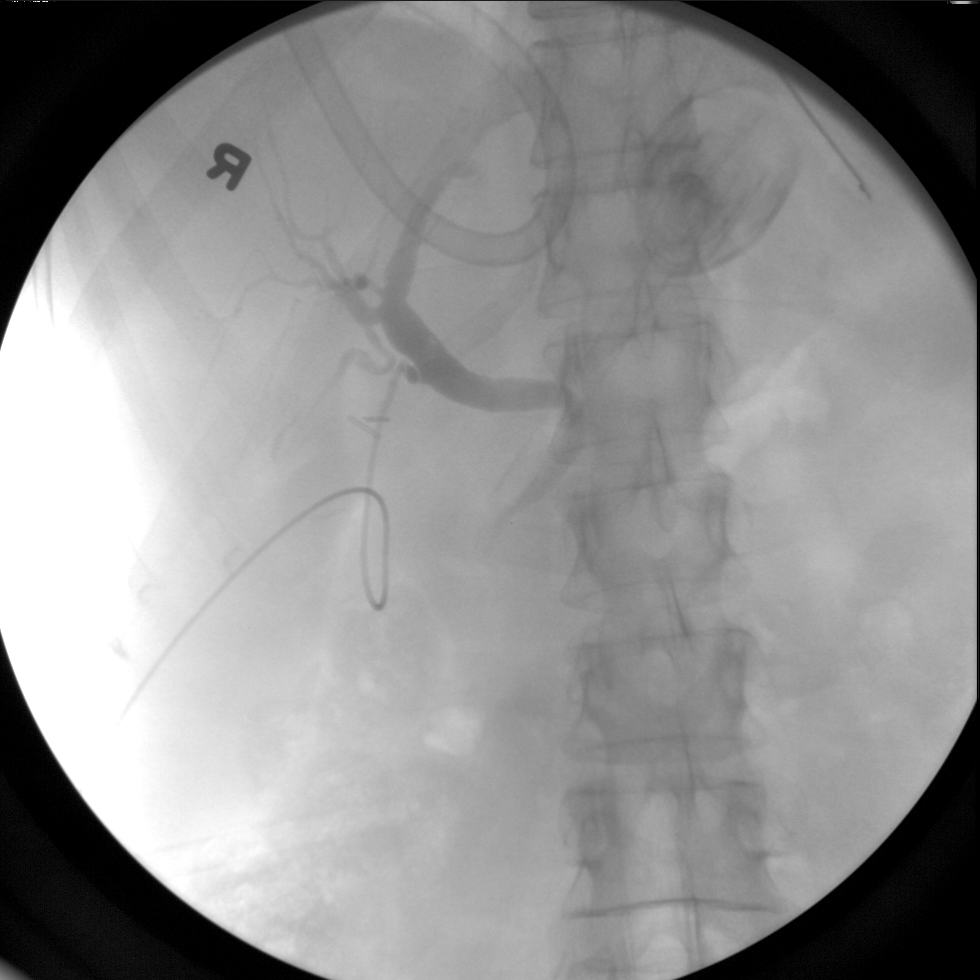
[im 1/3]
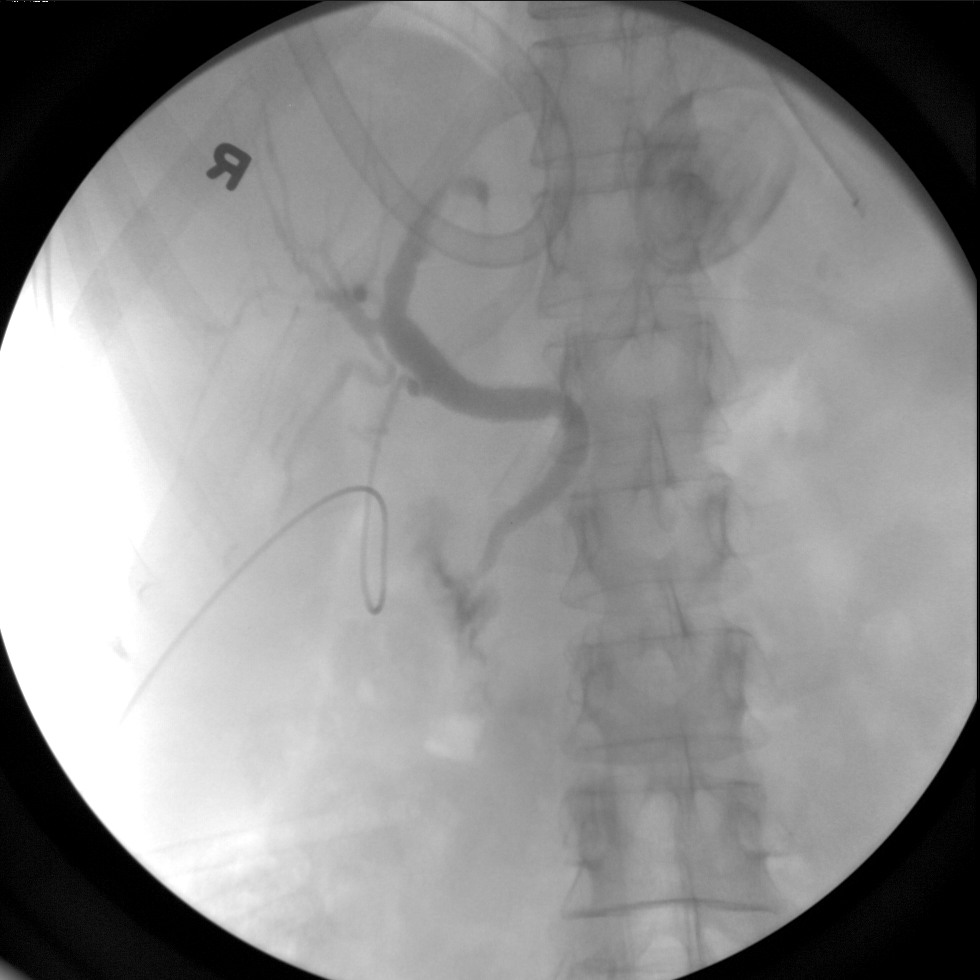
[im 1/3]
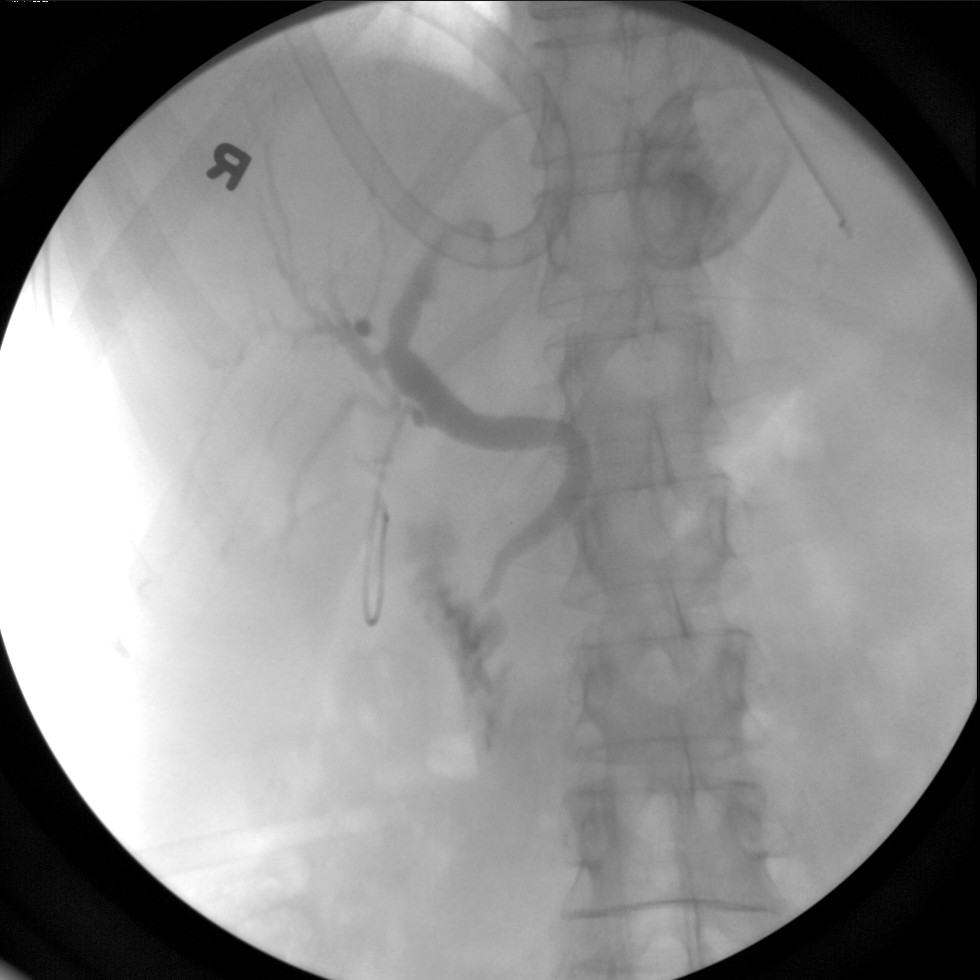
[im 2/3]
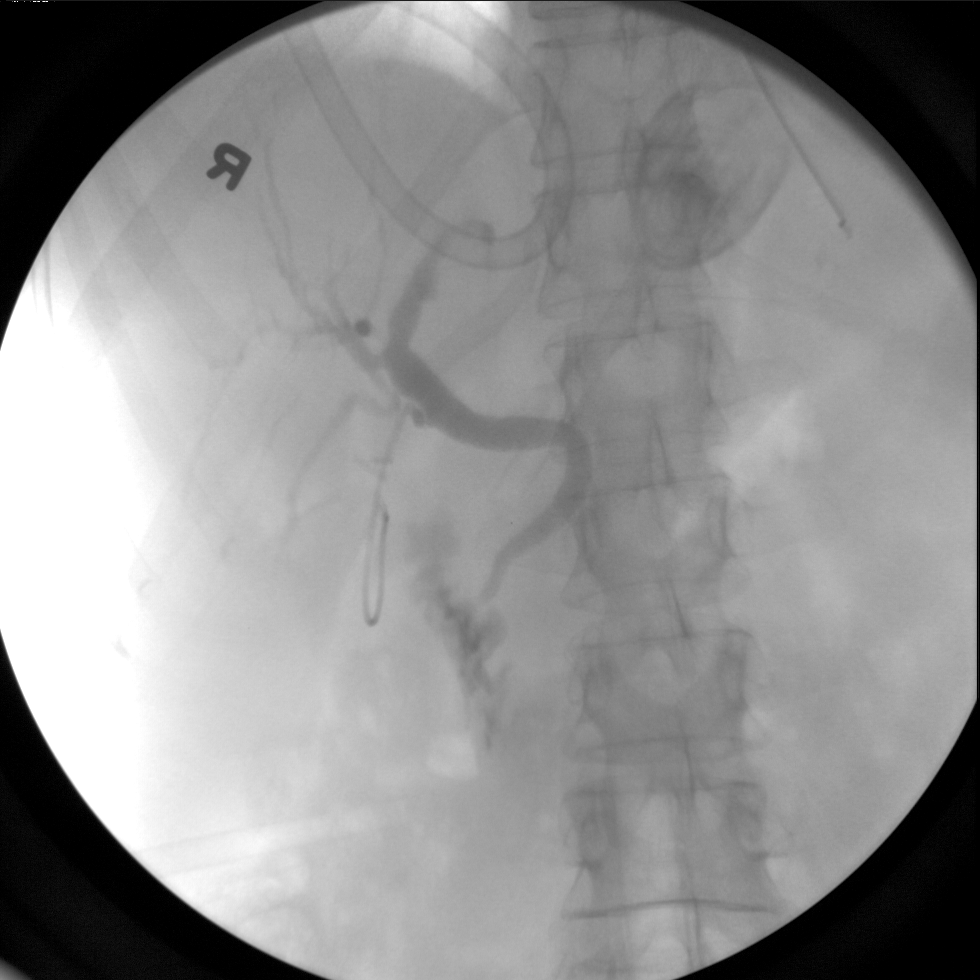
[im 3/3]
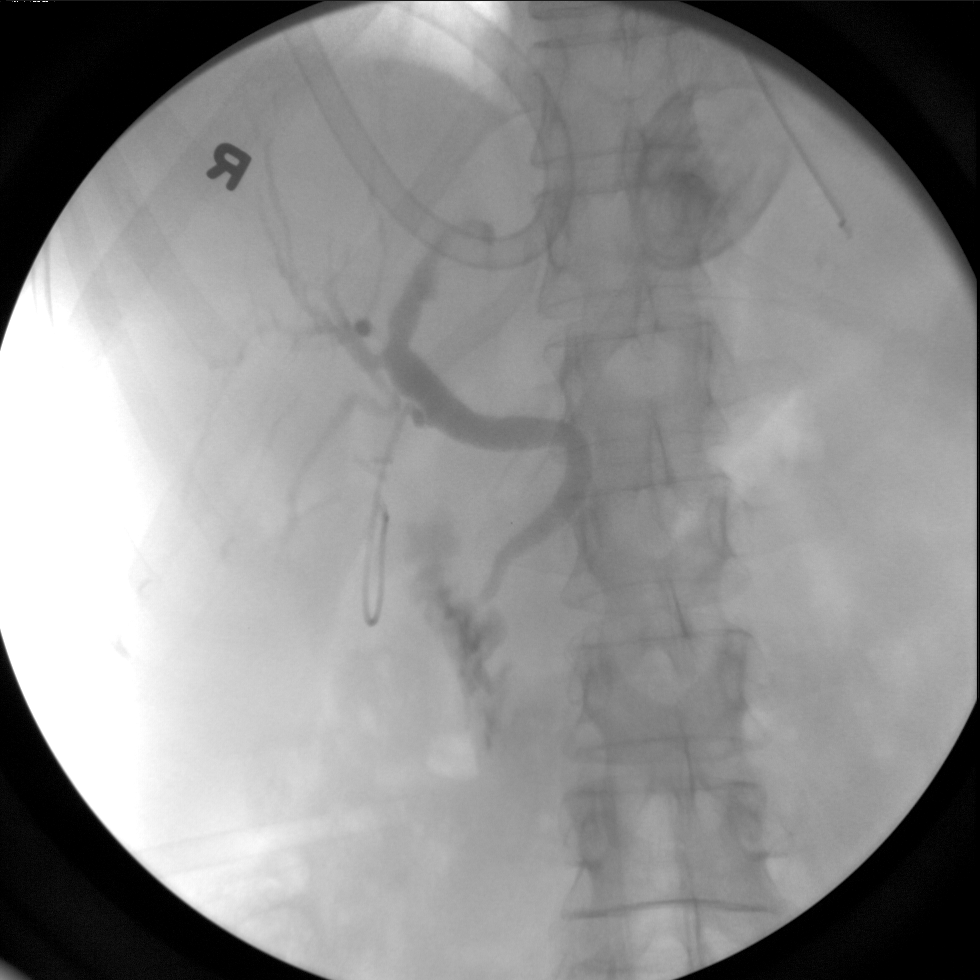

[6 of 6 positions shown; findings below may reference images not displayed]

FINDINGS: The common duct and intrahepatic ducts are normal in
caliber and appearance with no fixed filling defects.  Contrast
flows into the duodenum.  There is no extravasation.  Fluoroscopy
time is 8 seconds.
IMPRESSION: Normal intraoperative cholangiogram.

## 2012-05-03 ENCOUNTER — Other Ambulatory Visit: Payer: Self-pay | Admitting: *Deleted

## 2012-05-03 MED ORDER — ESTRADIOL 0.5 MG PO TABS: 0.5000 mg | ORAL_TABLET | Freq: Every day | ORAL | Status: DC

## 2012-05-03 NOTE — Telephone Encounter (Signed)
Received faxed refill request from pharmacy. Last office visit 08/31/11. Is it okay to refill medication?

## 2012-05-30 ENCOUNTER — Other Ambulatory Visit: Payer: Self-pay | Admitting: *Deleted

## 2012-05-30 MED ORDER — ESTRADIOL 0.5 MG PO TABS: 0.5000 mg | ORAL_TABLET | Freq: Every day | ORAL | Status: DC

## 2012-05-30 NOTE — Telephone Encounter (Signed)
Patient not seen here for cpx in over 1 year. Okay to refill?

## 2012-05-30 NOTE — Telephone Encounter (Signed)
Ok to refill one month supply.  Needs to be seen for further refills. 

## 2012-05-31 ENCOUNTER — Other Ambulatory Visit: Payer: Self-pay | Admitting: Family Medicine

## 2012-05-31 NOTE — Telephone Encounter (Signed)
Last seen 09/01/11

## 2012-06-14 ENCOUNTER — Encounter: Payer: Self-pay | Admitting: Internal Medicine

## 2012-06-22 ENCOUNTER — Other Ambulatory Visit: Payer: Self-pay | Admitting: *Deleted

## 2012-06-22 MED ORDER — ESTRADIOL 0.5 MG PO TABS: 0.5000 mg | ORAL_TABLET | Freq: Every day | ORAL | Status: DC

## 2012-07-07 ENCOUNTER — Ambulatory Visit (INDEPENDENT_AMBULATORY_CARE_PROVIDER_SITE_OTHER): Payer: Medicare Other

## 2012-07-07 DIAGNOSIS — Z23 Encounter for immunization: Secondary | ICD-10-CM

## 2012-07-19 ENCOUNTER — Other Ambulatory Visit: Payer: Self-pay | Admitting: Family Medicine

## 2012-07-21 NOTE — Telephone Encounter (Signed)
Ok to refill one month supply.  Needs to be seen for CPX for further refills.

## 2012-07-21 NOTE — Telephone Encounter (Signed)
Pt has only been seen for acute visits in past year.  No upcoming appts scheduled.

## 2012-07-28 ENCOUNTER — Other Ambulatory Visit: Payer: Self-pay | Admitting: Family Medicine

## 2012-07-28 ENCOUNTER — Other Ambulatory Visit: Payer: Self-pay | Admitting: Internal Medicine

## 2012-08-24 ENCOUNTER — Other Ambulatory Visit: Payer: Self-pay | Admitting: Family Medicine

## 2012-08-30 ENCOUNTER — Other Ambulatory Visit: Payer: Self-pay | Admitting: Family Medicine

## 2012-08-30 DIAGNOSIS — Z1231 Encounter for screening mammogram for malignant neoplasm of breast: Secondary | ICD-10-CM

## 2012-09-15 ENCOUNTER — Ambulatory Visit (HOSPITAL_COMMUNITY)
Admission: RE | Admit: 2012-09-15 | Discharge: 2012-09-15 | Disposition: A | Discharge status: Home / Self Care | Payer: Medicare Other | Source: Ambulatory Visit | Attending: Family Medicine | Admitting: Family Medicine

## 2012-09-15 DIAGNOSIS — Z1231 Encounter for screening mammogram for malignant neoplasm of breast: Secondary | ICD-10-CM | POA: Insufficient documentation

## 2012-09-19 ENCOUNTER — Other Ambulatory Visit: Payer: Self-pay | Admitting: Family Medicine

## 2012-09-21 ENCOUNTER — Other Ambulatory Visit: Payer: Self-pay | Admitting: Internal Medicine

## 2012-09-21 ENCOUNTER — Other Ambulatory Visit: Payer: Self-pay | Admitting: Family Medicine

## 2012-09-21 NOTE — Telephone Encounter (Signed)
Medicine sent to pharmacy

## 2012-09-21 NOTE — Telephone Encounter (Signed)
Ok to refill one month. Needs to be seen for further refills. 

## 2012-09-21 NOTE — Telephone Encounter (Signed)
Refill request for amitriptyline, pt hasn't been seen by you since 4/12 and has no upcoming appts.

## 2012-09-23 ENCOUNTER — Other Ambulatory Visit: Payer: Self-pay | Admitting: Family Medicine

## 2012-09-23 ENCOUNTER — Ambulatory Visit (INDEPENDENT_AMBULATORY_CARE_PROVIDER_SITE_OTHER): Payer: Medicare Other | Admitting: Family Medicine

## 2012-09-23 ENCOUNTER — Encounter: Payer: Self-pay | Admitting: Family Medicine

## 2012-09-23 VITALS — BP 128/80 | HR 84 | Temp 97.7°F | Wt 121.0 lb

## 2012-09-23 DIAGNOSIS — E785 Hyperlipidemia, unspecified: Secondary | ICD-10-CM

## 2012-09-23 DIAGNOSIS — Z79890 Hormone replacement therapy (postmenopausal): Secondary | ICD-10-CM

## 2012-09-23 NOTE — Progress Notes (Signed)
71 yo here for follow up.  HLD-   On Simvastatin 10 mg daily and fish oil. No myalgias.   Lab Results  Component Value Date   CHOL 190 01/13/2011   HDL 70.20 01/13/2011   LDLCALC 81 01/13/2011   LDLDIRECT 159.1 11/11/2010   TRIG 193.0* 01/13/2011   CHOLHDL 3 01/13/2011   Postmenopausal- has been on estrace for over 30 years.  Multiple attempts to wean off but could not tolerate.  S/p hysterectomy.  Stays current with cancer screening- last mammogram was 2 weeks ago.  Denies any post menopausal bleeding.  Patient Active Problem List  Diagnosis  . HYPERLIPIDEMIA  . COMMON MIGRAINE  . CERUMEN IMPACTION, BILATERAL  . PNEUMONIA  . ESOPHAGEAL STRICTURE  . GERD  . LYMPHOCYTIC COLITIS  . DIVERTICULOSIS-COLON  . IBS  . TIBIALIS TENDINITIS  . OSTEOPOROSIS  . HEADACHE  . FLATULENCE-GAS-BLOATING  . URINARY INCONTINENCE  . GROIN STRAIN, RIGHT  . OPEN WOUND FOREARM WITHOUT MENTION COMPLICATION  . ABRASION, LEG  . Palpitations  . Near syncope  . Right upper quadrant pain  . Symptomatic cholelithiasis  . Cold hands and feet without peripheral vascular disease  . Postmenopausal HRT (hormone replacement therapy)   Past Medical History  Diagnosis Date  . Osteoporosis   . Urinary incontinence   . Migraines   . Lymphocytic colitis   . GERD (gastroesophageal reflux disease)     esophageal stricture  . IBS (irritable bowel syndrome)   . Cataract    Past Surgical History  Procedure Date  . Shoulder surgery 10/2006    left   . Vaginal hysterectomy     no oopherectomy  . Cholecystectomy 10/20/2011    Procedure: LAPAROSCOPIC CHOLECYSTECTOMY WITH INTRAOPERATIVE CHOLANGIOGRAM;  Surgeon: Adolph Pollack, MD;  Location: [facility] OR;  Service: General;  Laterality: N/A;  . Cholecystectomy   . Cataract extraction     bilateral   History  Substance Use Topics  . Smoking status: Former Smoker -- 1.0 packs/day for 10 years    Types: Cigarettes    Quit date: 01/22/1991  . Smokeless tobacco: Former  Neurosurgeon    Quit date: 01/15/1991  . Alcohol Use: No   Family History  Problem Relation Age of Onset  . Cancer Mother     myeloma, multiple   Allergies  Allergen Reactions  . Cefdinir     REACTION: diarrhea  . Codeine     REACTION: nausea  . Hydrocodone-Acetaminophen     REACTION: nausea   Current Outpatient Prescriptions on File Prior to Visit  Medication Sig Dispense Refill  . amitriptyline (ELAVIL) 25 MG tablet TAKE 1 TABLET BY MOUTH AT BEDTIME  30 tablet  0  . Ascorbic Acid (VITAMIN C) 500 MG tablet Take 500 mg by mouth daily.        Marland Kitchen aspirin-acetaminophen-caffeine ([employer] MIGRAINE RELIEF) 250-250-65 MG per tablet Take 1 tablet by mouth every 6 (six) hours as needed.        . Calcium Carbonate-Vit D-Min (CALCIUM 1200) 1200-1000 MG-UNIT CHEW Chew 1 tablet by mouth 2 (two) times daily.        . cholecalciferol (VITAMIN D) 1000 UNITS tablet Take 1,000 Units by mouth daily.        Marland Kitchen estradiol (ESTRACE) 0.5 MG tablet TAKE 1 TABLET BY MOUTH EVERY DAY  30 tablet  0  . fish oil-omega-3 fatty acids 1000 MG capsule Take 1 g by mouth daily.        . hyoscyamine (LEVSIN SL) 0.125  MG SL tablet Place 0.125 mg under the tongue every 6 (six) hours as needed.        . mometasone (NASONEX) 50 MCG/ACT nasal spray 2 sprays by Nasal route daily.        . pantoprazole (PROTONIX) 40 MG tablet Take 40 mg by mouth daily.        . pantoprazole (PROTONIX) 40 MG tablet TAKE 1 TABLET BY MOUTH ONCE DAILY 30-60 MINUTES PRIOR TO BREAKFAST  30 tablet  1  . Probiotic Product (ALIGN PO) Take 1 tablet by mouth daily.        . simvastatin (ZOCOR) 10 MG tablet Take 10 mg by mouth at bedtime.        . simvastatin (ZOCOR) 10 MG tablet TAKE 1 TABLET (10 MG TOTAL) BY MOUTH AT BEDTIME.  30 tablet  0  . zonisamide (ZONEGRAN) 100 MG capsule Take 200 mg by mouth daily.      The PMH, PSH, Social History, Family History, Medications, and allergies have been reviewed in [facility], and have been updated if relevant.   Review of Systems   See HPI   Physical Exam  BP 128/80  Pulse 84  Temp 97.7 F (36.5 C)  Wt 121 lb (54.885 kg)  General: alert, well-developed, well-nourished, and well-hydrated.  Head: Normocephalic and atraumatic without obvious abnormalities. No apparent alopecia or balding.  Eyes: PERRLA, Conjunctiva clear bilaterally.  Ears: External ear exam shows no significant lesions or deformities. Otoscopic examination reveals clear canals, tympanic membranes are intact bilaterally without bulging, retraction, inflammation or discharge. Hearing is grossly normal bilaterally.  Nose: mucosal erythema, mucosal edema, and airflow obstruction--mouth breathing.  Mouth: Oral mucosa and oropharynx without lesions or exudates. Teeth in good repair.  Lungs: Normal respiratory effort, chest expands symmetrically. Lungs are clear to auscultation, no crackles or wheezes.  Heart: Normal rate and regular rhythm. S1 and S2 normal without gallop, murmur, click, rub or other extra sounds.  Abdomen: soft and non-tender.  Msk:  normal ROM, no joint tenderness, and no joint swelling.  Extremities: no edema  Neurologic: alert & oriented X3 and gait normal.  Psych: Cognition and judgment appear intact. Alert and cooperative with normal attention span and concentration. No apparent delusions, illusions, hallucinations    Assessment and Plan: 1. HYPERLIPIDEMIA  Stable.  Returning next month for CPX.  She would prefer to have lipid panel and CMET drawn at that time.  2. Postmenopausal HRT (hormone replacement therapy)  Could not tolerate wean. Continue estrace- UTD cancer screening without any post menopausal bleeding. >25 min spent with face to face with patient, >50% counseling and/or coordinating care Discussed again risks and benefits of HRT.

## 2012-09-30 ENCOUNTER — Other Ambulatory Visit: Payer: Self-pay | Admitting: Internal Medicine

## 2012-09-30 MED ORDER — PANTOPRAZOLE SODIUM 40 MG PO TBEC: 40.0000 mg | DELAYED_RELEASE_TABLET | Freq: Every day | ORAL | Status: DC

## 2012-09-30 NOTE — Telephone Encounter (Signed)
Protonix refill sent in, pt made January appointment.

## 2012-10-19 ENCOUNTER — Other Ambulatory Visit: Payer: Self-pay | Admitting: Family Medicine

## 2012-10-25 ENCOUNTER — Encounter: Payer: Self-pay | Admitting: Internal Medicine

## 2012-10-25 ENCOUNTER — Ambulatory Visit (INDEPENDENT_AMBULATORY_CARE_PROVIDER_SITE_OTHER): Payer: Medicare Other | Admitting: Internal Medicine

## 2012-10-25 VITALS — BP 152/72 | HR 66 | Ht 62.0 in | Wt 123.6 lb

## 2012-10-25 DIAGNOSIS — N8111 Cystocele, midline: Secondary | ICD-10-CM

## 2012-10-25 DIAGNOSIS — K219 Gastro-esophageal reflux disease without esophagitis: Secondary | ICD-10-CM

## 2012-10-25 DIAGNOSIS — K648 Other hemorrhoids: Secondary | ICD-10-CM

## 2012-10-25 DIAGNOSIS — N8110 Cystocele, unspecified: Secondary | ICD-10-CM | POA: Insufficient documentation

## 2012-10-25 DIAGNOSIS — K589 Irritable bowel syndrome without diarrhea: Secondary | ICD-10-CM

## 2012-10-25 MED ORDER — PANTOPRAZOLE SODIUM 40 MG PO TBEC: 40.0000 mg | DELAYED_RELEASE_TABLET | Freq: Every day | ORAL | Status: DC

## 2012-10-25 MED ORDER — HYDROCORTISONE 2.5 % RE CREA: TOPICAL_CREAM | Freq: Two times a day (BID) | RECTAL | Status: AC | PRN

## 2012-10-25 MED ORDER — AMITRIPTYLINE HCL 10 MG PO TABS: 10.0000 mg | ORAL_TABLET | Freq: Every day | ORAL | Status: DC

## 2012-10-25 NOTE — Patient Instructions (Addendum)
Change your Amitriptyline to 10mg  at bedtime.  We are giving you a handout on High Fiber diet information to read over.  We have sent the medication to your pharmacy for you to pick up at your convenience.  Continue to use your metamucil wafers.  We have made you an appointment with Dr. Alfredo Martinez at [facility] for 11/07/12 at 8:15am, arrive at 8:00am. They will be mailing you paperwork for you to fill out and take to the visit along with a picture ID and a list of your medicines.  Thank you for choosing me and [facility].  Iva Boop, M.D., [facility]

## 2012-10-25 NOTE — Progress Notes (Signed)
Subjective:    Patient ID: Robin Ellis, female    DOB: 1941/02/06, 72 y.o.   MRN: [id number]  HPI The patient presents for followup of GERD and IBS. She is also having some rectal bleeding.  I have started her on amitriptyline 25 mg at bedtime about 1-2 years ago. That has really helped her IBS but she thinks it constipates her somewhat and she'll have to strain to stool at times and see some rectal bleeding on the tissue paper. This happens 2 or 3 times a week. She would like a refill of pantoprazole, her GERD is under control using that and dietary modification. She is having problems with bladder prolapse. She saw her gynecologist in the fall, and it was recommended that a surgery be performed to repair this but her gynecologist said that she did not do that at the procedure and we'll get back to her. The patient is not yet heard back.  He would like to reduce the dose of the amitriptyline if possible to see if she could still get benefit her IBS but not have a side effect of constipation. Allergies  Allergen Reactions  . Cefdinir     REACTION: diarrhea  . Codeine     REACTION: nausea  . Hydrocodone-Acetaminophen     REACTION: nausea   Outpatient Prescriptions Prior to Visit  Medication Sig Dispense Refill  . Ascorbic Acid (VITAMIN C) 500 MG tablet Take 500 mg by mouth daily.        Marland Kitchen aspirin-acetaminophen-caffeine ([employer]S MIGRAINE RELIEF) 250-250-65 MG per tablet Take 1 tablet by mouth every 6 (six) hours as needed.        . Calcium Carbonate-Vit D-Min (CALCIUM 1200) 1200-1000 MG-UNIT CHEW Chew 1 tablet by mouth 2 (two) times daily.        . cholecalciferol (VITAMIN D) 1000 UNITS tablet Take 1,000 Units by mouth daily.        Marland Kitchen estradiol (ESTRACE) 0.5 MG tablet TAKE 1 TABLET BY MOUTH EVERY DAY  30 tablet  1  . fish oil-omega-3 fatty acids 1000 MG capsule Take 1 g by mouth daily.        . hyoscyamine (LEVSIN SL) 0.125 MG SL tablet Place 0.125 mg under the tongue every 6 (six) hours  as needed.        . mometasone (NASONEX) 50 MCG/ACT nasal spray 2 sprays by Nasal route daily.        . Probiotic Product (ALIGN PO) Take 1 tablet by mouth daily.        . simvastatin (ZOCOR) 10 MG tablet Take 10 mg by mouth at bedtime.        Marland Kitchen zonisamide (ZONEGRAN) 100 MG capsule Take 200 mg by mouth daily.      .      .      .      .      .       Last reviewed on 10/25/2012  4:05 PM by Iva Boop, MD Past Medical History  Diagnosis Date  . Osteoporosis   . Urinary incontinence   . Migraines   . Lymphocytic colitis   . GERD (gastroesophageal reflux disease)     esophageal stricture  . IBS (irritable bowel syndrome)   . Cataract   . Diverticulosis   . Erosive gastritis    Past Surgical History  Procedure Date  . Shoulder surgery 10/2006    left   . Vaginal hysterectomy     no oopherectomy  .  Cholecystectomy 10/20/2011    Procedure: LAPAROSCOPIC CHOLECYSTECTOMY WITH INTRAOPERATIVE CHOLANGIOGRAM;  Surgeon: Adolph Pollack, MD;  Location: [facility] OR;  Service: General;  Laterality: N/A;  . Cataract extraction     bilateral  . Esophagogastroduodenoscopy (egd) with esophageal dilation 2009  . Colonoscopy w/ biopsies 2009   History   Social History  . Marital Status: Married    Spouse Name: N/A    Number of Children: 3  . Years of Education: N/A   Occupational History  . Retired from [employer]    Social History Main Topics  . Smoking status: Former Smoker -- 1.0 packs/day for 10 years    Types: Cigarettes    Quit date: 01/22/1991  . Smokeless tobacco: Former Neurosurgeon    Quit date: 01/15/1991  . Alcohol Use: No  . Drug Use: No         Family History  Problem Relation Age of Onset  . Cancer Mother     myeloma, multiple    Review of Systems As above    Objective:   Physical Exam General:  NAD Eyes:   anicteric Rectal exam:  With female staff present, anoderm show some protruding and inflamed hemorrhoids and a small tag. Digital rectal exam reveals  some mild anal stenosis with a hemorrhoids in the canal no rectal mass. Brown stool present.  Data Reviewed:    EGD and colonoscopy in 2009 are reviewed       Assessment & Plan:   1. GERD (gastroesophageal reflux disease)   Refill pantoprazole 40 mg daily for a year   2. IBS (irritable bowel syndrome)   Will reduce the dose of the amitriptyline 10 mg at bedtime  She will try to increase fiber using Metamucil wafer stand or increasing fiber in her diet, a handout was given  I did not formally assess her pelvic floor today on rectal exam but she may have some disordered defecation and pelvic floor problems are likely I would think given the history of bladder prolapse   3. Hemorrhoids, internal, with bleeding   Treat constipation and straining problems, hydrocortisone cream to be used as needed  4. Bladder prolapse, female, acquired   She is interested in seeing another specialist, I will refer her to Dr. Perley Jain of urology    CC: Ruthe Mannan, MD and Lorin Picket McDiarmid, MD

## 2012-10-26 NOTE — Progress Notes (Signed)
Patient ID: Robin Ellis, female   DOB: 26-Nov-1940, 72 y.o.   MRN: [id number] Faxed office visit notes from 10/25/12 to Dr. Alfredo Martinez at [phone] for pts upcoming office visit 11/07/12 at 8:15am.

## 2012-11-04 ENCOUNTER — Other Ambulatory Visit: Payer: Self-pay | Admitting: Family Medicine

## 2012-11-04 DIAGNOSIS — E785 Hyperlipidemia, unspecified: Secondary | ICD-10-CM

## 2012-11-04 DIAGNOSIS — Z0000 Encounter for general adult medical examination without abnormal findings: Secondary | ICD-10-CM

## 2012-11-16 ENCOUNTER — Other Ambulatory Visit (INDEPENDENT_AMBULATORY_CARE_PROVIDER_SITE_OTHER): Payer: Medicare Other

## 2012-11-16 ENCOUNTER — Other Ambulatory Visit: Payer: Self-pay | Admitting: Family Medicine

## 2012-11-16 DIAGNOSIS — E785 Hyperlipidemia, unspecified: Secondary | ICD-10-CM

## 2012-11-16 DIAGNOSIS — Z0000 Encounter for general adult medical examination without abnormal findings: Secondary | ICD-10-CM

## 2012-11-16 LAB — LIPID PANEL
HDL: 70.3 mg/dL (ref >39.00)
LDL Cholesterol: 88 mg/dL (ref 0–99)
Total CHOL/HDL Ratio: 3
VLDL: 32.8 mg/dL (ref 0.0–40.0)

## 2012-11-16 LAB — COMPREHENSIVE METABOLIC PANEL
ALT: 14 U/L (ref 0–35)
AST: 19 U/L (ref 0–37)
Alkaline Phosphatase: 59 U/L (ref 39–117)
Creatinine, Ser: 0.9 mg/dL (ref 0.4–1.2)
GFR: 62.7 mL/min (ref >60.00)
Sodium: 138 mEq/L (ref 135–145)
Total Bilirubin: 0.3 mg/dL (ref 0.3–1.2)

## 2012-11-16 LAB — CBC WITH DIFFERENTIAL/PLATELET
Eosinophils Relative: 5 % (ref 0.0–5.0)
Lymphocytes Relative: 23.1 % (ref 12.0–46.0)
MCV: 89.9 fl (ref 78.0–100.0)
Monocytes Absolute: 0.4 10*3/uL (ref 0.1–1.0)
Neutrophils Relative %: 64.6 % (ref 43.0–77.0)
Platelets: 307 10*3/uL (ref 150.0–400.0)
WBC: 7 10*3/uL (ref 4.5–10.5)

## 2012-11-22 ENCOUNTER — Ambulatory Visit (INDEPENDENT_AMBULATORY_CARE_PROVIDER_SITE_OTHER): Payer: Medicare Other | Admitting: Family Medicine

## 2012-11-22 ENCOUNTER — Encounter: Payer: Self-pay | Admitting: Family Medicine

## 2012-11-22 VITALS — BP 120/80 | HR 80 | Temp 97.6°F | Ht 61.5 in | Wt 122.0 lb

## 2012-11-22 DIAGNOSIS — N8110 Cystocele, unspecified: Secondary | ICD-10-CM | POA: Insufficient documentation

## 2012-11-22 DIAGNOSIS — E785 Hyperlipidemia, unspecified: Secondary | ICD-10-CM

## 2012-11-22 DIAGNOSIS — Z780 Asymptomatic menopausal state: Secondary | ICD-10-CM

## 2012-11-22 DIAGNOSIS — Z79890 Hormone replacement therapy (postmenopausal): Secondary | ICD-10-CM

## 2012-11-22 DIAGNOSIS — Z0000 Encounter for general adult medical examination without abnormal findings: Secondary | ICD-10-CM | POA: Insufficient documentation

## 2012-11-22 DIAGNOSIS — N8111 Cystocele, midline: Secondary | ICD-10-CM

## 2012-11-22 NOTE — Patient Instructions (Addendum)
Good to see you, Ms Elza. Please stop by to see Shirlee Limerick on your way out to set up your bone density scan.

## 2012-11-22 NOTE — Progress Notes (Signed)
72 yo very pleasant female here for annual medicare wellness visit.  I have personally reviewed the Medicare Annual Wellness questionnaire and have noted 1. The patient's medical and social history 2. Their use of alcohol, tobacco or illicit drugs 3. Their current medications and supplements 4. The patient's functional ability including ADL's, fall risks, home safety risks and hearing or visual             impairment. 5. Diet and physical activities 6. Evidence for depression or mood disorders  End of life wishes discussed and updated in Social History.   HLD-   On Simvastatin 10 mg daily and fish oil. No myalgias.   Lab Results  Component Value Date   CHOL 191 11/16/2012   HDL 70.30 11/16/2012   LDLCALC 88 11/16/2012   LDLDIRECT 159.1 11/11/2010   TRIG 164.0* 11/16/2012   CHOLHDL 3 11/16/2012   Postmenopausal- has been on estrace for over 30 years.  Multiple attempts to wean off but could not tolerate.  S/p hysterectomy.  Stays current with cancer screening- last mammogram was in December 2013.  Denies any post menopausal bleeding.  Bladder prolapse- just had urodynamics done last week with Dr. McDiarmid.  Has follow up scheduled with him.  Patient Active Problem List  Diagnosis  . HYPERLIPIDEMIA  . COMMON MIGRAINE  . ESOPHAGEAL STRICTURE  . GERD  . LYMPHOCYTIC COLITIS  . IBS  . OSTEOPOROSIS  . URINARY INCONTINENCE  . Palpitations  . Cold hands and feet without peripheral vascular disease  . Postmenopausal HRT (hormone replacement therapy)  . Routine general medical examination at a health care facility   Past Medical History  Diagnosis Date  . Osteoporosis   . Urinary incontinence   . Migraines   . Lymphocytic colitis   . GERD (gastroesophageal reflux disease)     esophageal stricture  . IBS (irritable bowel syndrome)   . Cataract   . Diverticulosis   . Erosive gastritis   . Bladder prolapse, female, acquired    Past Surgical History  Procedure Laterality Date  .  Shoulder surgery  10/2006    left   . Vaginal hysterectomy      no oopherectomy  . Cholecystectomy  10/20/2011    Procedure: LAPAROSCOPIC CHOLECYSTECTOMY WITH INTRAOPERATIVE CHOLANGIOGRAM;  Surgeon: Adolph Pollack, MD;  Location: [facility] OR;  Service: General;  Laterality: N/A;  . Cataract extraction      bilateral  . Esophagogastroduodenoscopy (egd) with esophageal dilation  2009  . Colonoscopy w/ biopsies  2009   History  Substance Use Topics  . Smoking status: Former Smoker -- 1.00 packs/day for 10 years    Types: Cigarettes    Quit date: 01/22/1991  . Smokeless tobacco: Former Neurosurgeon    Quit date: 01/15/1991  . Alcohol Use: No   Family History  Problem Relation Age of Onset  . Cancer Mother     myeloma, multiple   Allergies  Allergen Reactions  . Cefdinir     REACTION: diarrhea  . Codeine     REACTION: nausea  . Hydrocodone-Acetaminophen     REACTION: nausea   Current Outpatient Prescriptions on File Prior to Visit  Medication Sig Dispense Refill  . amitriptyline (ELAVIL) 10 MG tablet Take 1 tablet (10 mg total) by mouth at bedtime.  30 tablet  11  . Ascorbic Acid (VITAMIN C) 500 MG tablet Take 500 mg by mouth daily.        Marland Kitchen aspirin-acetaminophen-caffeine ([employer] MIGRAINE RELIEF) 250-250-65 MG per tablet Take  1 tablet by mouth every 6 (six) hours as needed.        . Calcium Carbonate-Vit D-Min (CALCIUM 1200) 1200-1000 MG-UNIT CHEW Chew 1 tablet by mouth 2 (two) times daily.        . cholecalciferol (VITAMIN D) 1000 UNITS tablet Take 1,000 Units by mouth daily.        Marland Kitchen estradiol (ESTRACE) 0.5 MG tablet TAKE 1 TABLET BY MOUTH EVERY DAY  30 tablet  0  . fish oil-omega-3 fatty acids 1000 MG capsule Take 1 g by mouth daily.        . hydrocortisone (ANUSOL-HC) 2.5 % rectal cream Place rectally 2 (two) times daily as needed for hemorrhoids. May apply with finger  30 g  1  . hyoscyamine (LEVSIN SL) 0.125 MG SL tablet Place 0.125 mg under the tongue every 6 (six) hours as needed.         . mometasone (NASONEX) 50 MCG/ACT nasal spray 2 sprays by Nasal route daily.        . pantoprazole (PROTONIX) 40 MG tablet Take 1 tablet (40 mg total) by mouth daily.  30 tablet  11  . Probiotic Product (ALIGN PO) Take 1 tablet by mouth daily.        . simvastatin (ZOCOR) 10 MG tablet Take 10 mg by mouth at bedtime.        Marland Kitchen zonisamide (ZONEGRAN) 100 MG capsule Take 200 mg by mouth daily.       No current facility-administered medications on file prior to visit.  The PMH, PSH, Social History, Family History, Medications, and allergies have been reviewed in [facility], and have been updated if relevant.   Review of Systems  See HPI   Physical Exam  BP 120/80  Pulse 80  Temp(Src) 97.6 F (36.4 C)  Ht 5' 1.5" (1.562 m)  Wt 122 lb (55.339 kg)  BMI 22.68 kg/m2  General: alert, well-developed, well-nourished, and well-hydrated.  Head: Normocephalic and atraumatic without obvious abnormalities. No apparent alopecia or balding.  Eyes: PERRLA, Conjunctiva clear bilaterally.  Ears: External ear exam shows no significant lesions or deformities. Otoscopic examination reveals clear canals, tympanic membranes are intact bilaterally without bulging, retraction, inflammation or discharge. Hearing is grossly normal bilaterally.  Nose: mucosal erythema, mucosal edema, and airflow obstruction--mouth breathing.  Mouth: Oral mucosa and oropharynx without lesions or exudates. Teeth in good repair.  Lungs: Normal respiratory effort, chest expands symmetrically. Lungs are clear to auscultation, no crackles or wheezes.  Heart: Normal rate and regular rhythm. S1 and S2 normal without gallop, murmur, click, rub or other extra sounds.  Abdomen: soft and non-tender.  Msk:  normal ROM, no joint tenderness, and no joint swelling.  Extremities: no edema  Neurologic: alert & oriented X3 and gait normal.  Psych: Cognition and judgment appear intact. Alert and cooperative with normal attention span and concentration. No  apparent delusions, illusions, hallucinations    Assessment and Plan: 1. Routine general medical examination at a health care facility The patients weight, height, BMI and visual acuity have been recorded in the chart I have made referrals, counseling and provided education to the patient based review of the above and I have provided the pt with a written personalized care plan for preventive services.   2. HYPERLIPIDEMIA Well controlled on Zocor 10 mg daily.  3. Postmenopausal HRT (hormone replacement therapy) Denies any post menopausal bleeding. Symptoms improved.  4. Bladder prolapse, female, acquired Will likely be proceeding with bladder tacking surgery.  5. Postmenopausal estrogen  deficiency  - DG Bone Density; Future

## 2012-11-23 ENCOUNTER — Encounter: Payer: Medicare Other | Admitting: Family Medicine

## 2012-11-29 ENCOUNTER — Telehealth: Payer: Self-pay

## 2012-11-29 NOTE — Telephone Encounter (Signed)
Pt left v/m requesting copy of recent labs faxed to Dr Santiago Glad with [facility] at fax # [phone]. Pt just saw Dr Neale Burly this morning. Pt said if Dr Dayton Martes thought needed to send any other info to Dr Neale Burly to fax that also.Please advise.

## 2012-11-29 NOTE — Telephone Encounter (Signed)
Ok to fax labs as requested.

## 2012-11-30 ENCOUNTER — Ambulatory Visit (INDEPENDENT_AMBULATORY_CARE_PROVIDER_SITE_OTHER)
Admission: RE | Admit: 2012-11-30 | Discharge: 2012-11-30 | Disposition: A | Discharge status: Home / Self Care | Payer: Medicare Other | Source: Ambulatory Visit | Attending: Family Medicine | Admitting: Family Medicine

## 2012-11-30 DIAGNOSIS — Z780 Asymptomatic menopausal state: Secondary | ICD-10-CM

## 2012-11-30 NOTE — Telephone Encounter (Signed)
Copy of labs faxed. °

## 2012-12-08 ENCOUNTER — Other Ambulatory Visit: Payer: Self-pay | Admitting: Urology

## 2012-12-08 NOTE — Progress Notes (Signed)
Dr Sherron Monday-  NEED PRE OP ORDERS PLEASE-  HAS APPT PST 12/13/12  [facility]

## 2012-12-12 ENCOUNTER — Encounter (HOSPITAL_COMMUNITY): Payer: Self-pay | Admitting: Pharmacy Technician

## 2012-12-13 ENCOUNTER — Inpatient Hospital Stay (HOSPITAL_COMMUNITY): Admission: RE | Admit: 2012-12-13 | Payer: Medicare Other | Source: Ambulatory Visit

## 2012-12-15 ENCOUNTER — Other Ambulatory Visit: Payer: Self-pay | Admitting: Family Medicine

## 2013-01-02 ENCOUNTER — Telehealth: Payer: Self-pay

## 2013-01-02 MED ORDER — ACYCLOVIR 5 % EX OINT: TOPICAL_OINTMENT | CUTANEOUS | Status: AC

## 2013-01-02 NOTE — Telephone Encounter (Signed)
Rx sent 

## 2013-01-02 NOTE — Telephone Encounter (Signed)
Advised patient that script has been sent to pharmacy. 

## 2013-01-02 NOTE — Telephone Encounter (Signed)
Pt left v/m requesting Acyclovir ointment 5 % ; Dr Elana Alm gave her years ago. Pt wants to keep on had for fever blisters or genital herpes. 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 [facility].Please advise.

## 2013-01-10 ENCOUNTER — Other Ambulatory Visit: Payer: Self-pay | Admitting: Family Medicine

## 2013-01-23 ENCOUNTER — Encounter (HOSPITAL_COMMUNITY)
Admission: RE | Admit: 2013-01-23 | Discharge: 2013-01-23 | Disposition: A | Discharge status: Home / Self Care | Payer: Medicare Other | Source: Ambulatory Visit | Attending: Urology | Admitting: Urology

## 2013-01-23 ENCOUNTER — Encounter (HOSPITAL_COMMUNITY): Payer: Self-pay

## 2013-01-23 DIAGNOSIS — Z01812 Encounter for preprocedural laboratory examination: Secondary | ICD-10-CM | POA: Insufficient documentation

## 2013-01-23 HISTORY — DX: Other specified postprocedural states: Z98.890

## 2013-01-23 HISTORY — DX: Other specified postprocedural states: R11.2

## 2013-01-23 LAB — APTT: aPTT: 31 seconds (ref 24–37)

## 2013-01-23 LAB — PROTIME-INR
INR: 0.91 (ref 0.00–1.49)
Prothrombin Time: 12.2 seconds (ref 11.6–15.2)

## 2013-01-23 LAB — CBC
Hemoglobin: 13 g/dL (ref 12.0–15.0)
MCH: 29.5 pg (ref 26.0–34.0)
MCHC: 32.7 g/dL (ref 30.0–36.0)
Platelets: 318 10*3/uL (ref 150–400)
RDW: 13.3 % (ref 11.5–15.5)

## 2013-01-23 LAB — BASIC METABOLIC PANEL
BUN: 16 mg/dL (ref 6–23)
CO2: 27 mEq/L (ref 19–32)
Calcium: 9.6 mg/dL (ref 8.4–10.5)
GFR calc non Af Amer: 63 mL/min — ABNORMAL LOW (ref >90)
Glucose, Bld: 80 mg/dL (ref 70–99)

## 2013-01-23 NOTE — Patient Instructions (Signed)
ALLISON DESHOTELS  01/23/2013   Your procedure is scheduled on:  02/14/13   Report to [facility] at    0515  AM.  Call this number if you have problems the morning of surgery: [phone]   Remember:   Do not eat food or drink liquids after midnight.   Take these medicines the morning of surgery with A SIP OF WATER:    Do not wear jewelry, make-up or nail polish.  Do not wear lotions, powders, or perfumes.   Do not shave 48 hours prior to surgery.   Do not bring valuables to the hospital.  Contacts, dentures or bridgework may not be worn into surgery.  Leave suitcase in the car. After surgery it may be brought to your room.  For patients admitted to the hospital, checkout time is 11:00 AM the day of  discharge.       SEE CHG INSTRUCTION SHEET    Please read over the following fact sheets that you were given: MRSA Information, coughing and deep breathing exercises, leg exercises, Blood Transfusion Fact Sheet                Failure to comply with these instructions may result in cancellation of your surgery.                Patient Signature ____________________________              Nurse Signature _____________________________

## 2013-02-13 MED ORDER — GENTAMICIN SULFATE 40 MG/ML IJ SOLN
250.0000 mg | INTRAVENOUS | Status: AC
  Administered 2013-02-14: 250 mg via INTRAVENOUS
  Filled 2013-02-13: qty 6.25

## 2013-02-13 NOTE — H&P (Signed)
History of Present Illness   Robin Ellis has stress incontinence and a little bit of urge incontinence. She has mild frequency. She has pelvic organ prolapse symptoms. She has a grade 3 cystocele and lost some vaginal cuff support. She had a moderate grade 2 rectocele. Her initial residual was 115 mL. She was here to discuss her urodynamics.  Review of Systems: No other change in bowel or neurologic systems.   On urodynamics, she did not void and was catheterized for 175 mL. Her maximum capacity was 500 mL. Bladder was unstable reaching pressures of 6 cm of water but she did not leak. Her Valsalva leak-point pressure at 500 mL was 42 cm of water and was moderate. These occurred with the packing out. There was no leaking with the packing in place. During voluntary voiding, she voided 272 mL with a maximum flow of 12 mL/sec. Maximum voiding pressure was 21 cm of water. Residual was 200 mL. EMG activity was normal. Bladder neck descended 3 cm. She had a large cystocele fluoroscopically. She had a hypersensitive bladder. Vaginal packing had to be removed for her to void. EMG activity was within normal limits. The details of the urodynamics are signed and dictated on the urodynamic sheet.   Importantly, she does not wear pads.    Past Medical History Problems  1. History of  Heartburn 787.1 2. History of  Hyperlipidemia 272.4 3. History of  Irritable Bowel Syndrome 564.1 4. History of  Migraine Headache 346.90  Surgical History Problems  1. History of  Breast Surgery Lumpectomy 2. History of  Cataract Surgery 3. History of  Cholecystectomy 4. History of  Hysteroscopy Of Uterus 5. History of  Shoulder Surgery Left  Current Meds 1. Acetaminophen TABS; Therapy: (Recorded:27Jan2014) to 2. Align CAPS; Therapy: (Recorded:27Jan2014) to 3. Anusol-HC 2.5 % CREA; Therapy: (Recorded:27Jan2014) to 4. Aspirin TABS; Therapy: (Recorded:27Jan2014) to 5. Elavil TABS; Therapy: (Recorded:27Jan2014) to 6.  Estrace TABS; Therapy: (Recorded:27Jan2014) to 7. Fish Oil CAPS; Therapy: (Recorded:27Jan2014) to 8. Levsin TABS; Therapy: (Recorded:27Jan2014) to 9. Nasonex 50 MCG/ACT Nasal Suspension; Therapy: (Recorded:27Jan2014) to 10. Protonix 40 MG Oral Packet; Therapy: (Recorded:27Jan2014) to 11. Vitamin C TABS; Therapy: (Recorded:27Jan2014) to 12. Vitamin D TABS; Therapy: (Recorded:27Jan2014) to 13. Zocor 10 MG Oral Tablet; Therapy: (Recorded:27Jan2014) to 14. Zonegran CAPS; Therapy: (Recorded:27Jan2014) to  Allergies Medication  1. Ceftin TABS 2. Codeine Derivatives 3. Hydrocodone-Acetaminophen CAPS  Family History Problems  1. Paternal history of  Death In The Family Father 2. Paternal history of  Death In The Family Mother died at age 36 3. Maternal history of  Death In The Family Mother mom died at age 34 from bone cancer 4. Maternal history of  Family Health Status Number Of Children 2 sons, 1 daughter  Social History Problems  1. Former Smoker V15.82 1/4 ppd, for 5 yrs, quit in 1990 2. Marital History - Currently Married 3. Occupation: Retired Denied  4. History of  Alcohol Use 5. History of  Caffeine Use  Assessment Assessed  1. Cystocele 596.89 2. Incomplete Emptying Of Bladder 788.21 3. Urge And Stress Incontinence 788.33  Plan Cystocele (596.89)  1. Follow-up Schedule Surgery Office  Follow-up  Done: 20Feb2014  Discussion/Summary   I drew Robin Ellis a picture. I discussed watchful waiting versus pessary versus transvaginal hysterectomy with vault suspension, cystocele repair and graft, and rectocele repair.   I drew her a picture and we talked about prolapse surgery in detail. Pros, cons, general surgical and anesthetic risks, and other options including behavioral therapy,  pessaries, and watchful waiting were discussed. She understands that prolapse repairs are successful in 80-85% of cases for prolapse symptoms and can recur anteriorly, posteriorly, and/or  apically. She understands that in most cases I use a graft and general risks were discussed. Surgical risks were described but not limited to the discussion of injury to neighboring structures including the bowel (with possible life-threatening sepsis and colostomy), bladder, urethra, vagina (all resulting in further surgery), and ureter (resulting in re-implantation). We talked about injury to nerves/soft tissue leading to debilitating and intractable pelvic, abdominal, and lower extremity pain syndromes and neuropathies. The risks of buttock pain, intractable dyspareunia, and vaginal narrowing and shortening with sequelae were discussed. Bleeding risks, transfusion rates, and infection were discussed. The risk of persistent, de novo, or worsening bladder and/or bowel incontinence/dysfunction was discussed. The need for CIC was described as well the usual post-operative course. The patient understands that she might not reach her treatment goal and that she might be worse following surgery.  I talked to her about a sling.  We talked about a sling in detail. Pros, cons, general surgical and anesthetic risks, and other options including behavioral therapy and watchful waiting were discussed. She understands that slings are generally successful in 90% of cases for stress incontinence, 50% for urge incontinence, and that in a small percentage of cases the incontinence can worsen. The risk of persistent, de novo, or worsening incontinence/dysfunction was discussed. Risks were described but not limited to the discussion of injury to neighboring structures including the bowel (with possible life-threatening sepsis and colostomy), bladder, urethra, vagina (all resulting in further surgery), and ureter (resulting in re-implantation). We also talked about the risk of retention requiring urethrolysis, extrusion requiring revision, and erosion resulting in further surgery. Bleeding risks and transfusion rates and the risk  of infection were discussed. The risk of pelvic and abdominal pain syndromes, dyspareunia, and neuropathies were discussed. The need for CIC was described as well as the usual postoperative course. The patient understands that she might not reach her treatment goal and that she might be worse following surgery. Mesh TV issues were discussed.  I was concerned about her elevated residual urine volumes and risk of retention.   Robin Ellis and I had a long talk and I think she made a good decision not to have a sling. Her leak-point pressures are a little bit low but she really does not leak much and she has been working a lot on her core muscles, etc, and Kegel exercises. We talked about delayed sling if needed.  She would like to proceed with prolapse surgery. We will proceed accordingly.  I am going to send a copy of my note to Dr. Perrin Maltese to keep him updated on her treatment course.  After a thorough review of the management options for the patient's condition the patient  elected to proceed with surgical therapy as noted above. We have discussed the potential benefits and risks of the procedure, side effects of the proposed treatment, the likelihood of the patient achieving the goals of the procedure, and any potential problems that might occur during the procedure or recuperation. Informed consent has been obtained.

## 2013-02-13 NOTE — Anesthesia Preprocedure Evaluation (Addendum)
Anesthesia Evaluation  Patient identified by MRN, date of birth, ID band Patient awake    Reviewed: Allergy & Precautions, H&P , NPO status , Patient's Chart, lab work & pertinent test results  History of Anesthesia Complications (+) PONV  Airway Mallampati: II TM Distance: >3 FB Neck ROM: Full    Dental  (+) Teeth Intact, Dental Advisory Given and Caps   Pulmonary pneumonia - (walking pneumonia 2009), resolved, former smoker,  breath sounds clear to auscultation        Cardiovascular negative cardio ROS  Rhythm:Regular Rate:Normal     Neuro/Psych  Headaches, negative psych ROS   GI/Hepatic Neg liver ROS, GERD-  Medicated,  Endo/Other  negative endocrine ROS  Renal/GU negative Renal ROS  negative genitourinary   Musculoskeletal negative musculoskeletal ROS (+)   Abdominal   Peds  Hematology negative hematology ROS (+)   Anesthesia Other Findings   Reproductive/Obstetrics                          Anesthesia Physical Anesthesia Plan  ASA: II  Anesthesia Plan: General   Post-op Pain Management:    Induction: Intravenous  Airway Management Planned: Oral ETT  Additional Equipment:   Intra-op Plan:   Post-operative Plan: Extubation in OR  Informed Consent: I have reviewed the patients History and Physical, chart, labs and discussed the procedure including the risks, benefits and alternatives for the proposed anesthesia with the patient or authorized representative who has indicated his/her understanding and acceptance.   Dental advisory given  Plan Discussed with: CRNA  Anesthesia Plan Comments:         Anesthesia Quick Evaluation

## 2013-02-14 ENCOUNTER — Encounter (HOSPITAL_COMMUNITY): Payer: Self-pay | Admitting: Anesthesiology

## 2013-02-14 ENCOUNTER — Observation Stay (HOSPITAL_COMMUNITY)
Admission: RE | Admit: 2013-02-14 | Discharge: 2013-02-15 | Disposition: A | Discharge status: Home / Self Care | Payer: Medicare Other | Source: Ambulatory Visit | Attending: Urology | Admitting: Urology

## 2013-02-14 ENCOUNTER — Ambulatory Visit (HOSPITAL_COMMUNITY): Payer: Medicare Other | Admitting: Anesthesiology

## 2013-02-14 ENCOUNTER — Encounter (HOSPITAL_COMMUNITY)
Admission: RE | Disposition: A | Discharge status: Home / Self Care | Payer: Self-pay | Source: Ambulatory Visit | Attending: Urology

## 2013-02-14 ENCOUNTER — Encounter (HOSPITAL_COMMUNITY): Payer: Self-pay | Admitting: *Deleted

## 2013-02-14 DIAGNOSIS — N816 Rectocele: Secondary | ICD-10-CM | POA: Insufficient documentation

## 2013-02-14 DIAGNOSIS — N3946 Mixed incontinence: Secondary | ICD-10-CM | POA: Insufficient documentation

## 2013-02-14 DIAGNOSIS — N8111 Cystocele, midline: Principal | ICD-10-CM | POA: Insufficient documentation

## 2013-02-14 HISTORY — PX: CYSTOSCOPY: SHX5120

## 2013-02-14 HISTORY — PX: ANTERIOR AND POSTERIOR REPAIR: SHX5121

## 2013-02-14 LAB — HEMOGLOBIN AND HEMATOCRIT, BLOOD
HCT: 37.2 % (ref 36.0–46.0)
Hemoglobin: 12.1 g/dL (ref 12.0–15.0)

## 2013-02-14 SURGERY — CYSTOSCOPY
Anesthesia: General | Wound class: Clean Contaminated

## 2013-02-14 MED ORDER — ESTRADIOL 1 MG PO TABS
0.5000 mg | ORAL_TABLET | Freq: Every day | ORAL | Status: DC
  Administered 2013-02-14 – 2013-02-15 (×2): 0.5 mg via ORAL
  Filled 2013-02-14 (×2): qty 0.5

## 2013-02-14 MED ORDER — ESTRADIOL 1 MG PO TABS: 0.5000 mg | ORAL_TABLET | Freq: Every day | ORAL | Status: DC

## 2013-02-14 MED ORDER — POLYVINYL ALCOHOL 1.4 % OP SOLN
1.0000 [drp] | OPHTHALMIC | Status: DC | PRN
  Filled 2013-02-14: qty 15

## 2013-02-14 MED ORDER — ONDANSETRON HCL 4 MG/2ML IJ SOLN
INTRAMUSCULAR | Status: DC | PRN
  Administered 2013-02-14: 4 mg via INTRAVENOUS

## 2013-02-14 MED ORDER — MORPHINE SULFATE 2 MG/ML IJ SOLN: 2.0000 mg | INTRAMUSCULAR | Status: DC | PRN

## 2013-02-14 MED ORDER — LACTATED RINGERS IV SOLN: INTRAVENOUS | Status: DC

## 2013-02-14 MED ORDER — LIDOCAINE-EPINEPHRINE (PF) 1 %-1:200000 IJ SOLN
INTRAMUSCULAR | Status: AC
  Filled 2013-02-14: qty 20

## 2013-02-14 MED ORDER — FLEET ENEMA 7-19 GM/118ML RE ENEM: 1.0000 | ENEMA | Freq: Once | RECTAL | Status: DC

## 2013-02-14 MED ORDER — LIDOCAINE HCL (CARDIAC) 20 MG/ML IV SOLN
INTRAVENOUS | Status: DC | PRN
  Administered 2013-02-14: 50 mg via INTRAVENOUS

## 2013-02-14 MED ORDER — ZONISAMIDE 100 MG PO CAPS
200.0000 mg | ORAL_CAPSULE | Freq: Every day | ORAL | Status: DC
  Administered 2013-02-14 – 2013-02-15 (×2): 200 mg via ORAL
  Filled 2013-02-14 (×2): qty 2

## 2013-02-14 MED ORDER — SUCCINYLCHOLINE CHLORIDE 20 MG/ML IJ SOLN
INTRAMUSCULAR | Status: DC | PRN
  Administered 2013-02-14: 100 mg via INTRAVENOUS

## 2013-02-14 MED ORDER — PANTOPRAZOLE SODIUM 40 MG PO TBEC
40.0000 mg | DELAYED_RELEASE_TABLET | Freq: Every day | ORAL | Status: DC
  Administered 2013-02-15: 40 mg via ORAL
  Filled 2013-02-14: qty 1

## 2013-02-14 MED ORDER — ROCURONIUM BROMIDE 100 MG/10ML IV SOLN
INTRAVENOUS | Status: DC | PRN
  Administered 2013-02-14: 30 mg via INTRAVENOUS
  Administered 2013-02-14: 10 mg via INTRAVENOUS

## 2013-02-14 MED ORDER — POLYETHYL GLYCOL-PROPYL GLYCOL 0.4-0.3 % OP SOLN: 1.0000 [drp] | Freq: Every evening | OPHTHALMIC | Status: DC | PRN

## 2013-02-14 MED ORDER — SODIUM CHLORIDE 0.9 % IR SOLN
Status: DC | PRN
  Administered 2013-02-14: 3000 mL

## 2013-02-14 MED ORDER — LIDOCAINE-EPINEPHRINE (PF) 1 %-1:200000 IJ SOLN
INTRAMUSCULAR | Status: DC | PRN
  Administered 2013-02-14: 28 mL

## 2013-02-14 MED ORDER — OXYCODONE-ACETAMINOPHEN 5-325 MG PO TABS
1.0000 | ORAL_TABLET | ORAL | Status: DC | PRN
  Administered 2013-02-14: 1 via ORAL
  Filled 2013-02-14: qty 2

## 2013-02-14 MED ORDER — SODIUM CHLORIDE 0.9 % IV SOLN
INTRAVENOUS | Status: AC
  Filled 2013-02-14: qty 1.5

## 2013-02-14 MED ORDER — PROPOFOL 10 MG/ML IV BOLUS
INTRAVENOUS | Status: DC | PRN
  Administered 2013-02-14: 120 mg via INTRAVENOUS

## 2013-02-14 MED ORDER — PROMETHAZINE HCL 25 MG/ML IJ SOLN: 6.2500 mg | INTRAMUSCULAR | Status: DC | PRN

## 2013-02-14 MED ORDER — ONDANSETRON HCL 4 MG/2ML IJ SOLN
4.0000 mg | INTRAMUSCULAR | Status: DC | PRN
  Administered 2013-02-14: 4 mg via INTRAVENOUS
  Filled 2013-02-14: qty 2

## 2013-02-14 MED ORDER — BUPIVACAINE-EPINEPHRINE PF 0.25-1:200000 % IJ SOLN
INTRAMUSCULAR | Status: AC
  Filled 2013-02-14: qty 60

## 2013-02-14 MED ORDER — FLUCONAZOLE 100MG IVPB
100.0000 mg | INTRAVENOUS | Status: AC
  Administered 2013-02-14: 100 mg via INTRAVENOUS
  Filled 2013-02-14: qty 50

## 2013-02-14 MED ORDER — GLYCOPYRROLATE 0.2 MG/ML IJ SOLN
INTRAMUSCULAR | Status: DC | PRN
  Administered 2013-02-14: 0.6 mg via INTRAVENOUS

## 2013-02-14 MED ORDER — NEOSTIGMINE METHYLSULFATE 1 MG/ML IJ SOLN
INTRAMUSCULAR | Status: DC | PRN
Start: 2013-02-14 — End: 2013-02-14
  Administered 2013-02-14: 5 mg via INTRAVENOUS

## 2013-02-14 MED ORDER — SIMVASTATIN 10 MG PO TABS
10.0000 mg | ORAL_TABLET | Freq: Every day | ORAL | Status: DC
  Administered 2013-02-14: 10 mg via ORAL
  Filled 2013-02-14 (×2): qty 1

## 2013-02-14 MED ORDER — LACTATED RINGERS IV SOLN
INTRAVENOUS | Status: DC | PRN
  Administered 2013-02-14 (×2): via INTRAVENOUS

## 2013-02-14 MED ORDER — DEXAMETHASONE SODIUM PHOSPHATE 4 MG/ML IJ SOLN
INTRAMUSCULAR | Status: DC | PRN
  Administered 2013-02-14: 4 mg via INTRAVENOUS

## 2013-02-14 MED ORDER — DEXTROSE-NACL 5-0.45 % IV SOLN
INTRAVENOUS | Status: DC
  Administered 2013-02-14: 21:00:00 via INTRAVENOUS
  Administered 2013-02-14: 1000 mL via INTRAVENOUS
  Administered 2013-02-15: 08:00:00 via INTRAVENOUS

## 2013-02-14 MED ORDER — ESTRADIOL 0.1 MG/GM VA CREA
TOPICAL_CREAM | VAGINAL | Status: DC | PRN
  Administered 2013-02-14: 2 via VAGINAL

## 2013-02-14 MED ORDER — ACETAMINOPHEN 10 MG/ML IV SOLN
1000.0000 mg | Freq: Four times a day (QID) | INTRAVENOUS | Status: DC
  Administered 2013-02-14 (×2): 1000 mg via INTRAVENOUS
  Filled 2013-02-14 (×5): qty 100

## 2013-02-14 MED ORDER — SODIUM CHLORIDE 0.9 % IR SOLN
Status: DC | PRN
  Administered 2013-02-14: 08:00:00

## 2013-02-14 MED ORDER — CIPROFLOXACIN HCL 250 MG PO TABS: 250.0000 mg | ORAL_TABLET | Freq: Two times a day (BID) | ORAL | Status: AC

## 2013-02-14 MED ORDER — PHENYLEPHRINE HCL 10 MG/ML IJ SOLN
INTRAMUSCULAR | Status: DC | PRN
  Administered 2013-02-14 (×2): 40 ug via INTRAVENOUS

## 2013-02-14 MED ORDER — SUMATRIPTAN SUCCINATE 25 MG PO TABS
25.0000 mg | ORAL_TABLET | Freq: Two times a day (BID) | ORAL | Status: DC | PRN
  Administered 2013-02-14: 25 mg via ORAL
  Filled 2013-02-14: qty 1

## 2013-02-14 MED ORDER — HYOSCYAMINE SULFATE 0.125 MG SL SUBL
0.1250 mg | SUBLINGUAL_TABLET | Freq: Four times a day (QID) | SUBLINGUAL | Status: DC | PRN
Start: 2013-02-14 — End: 2013-02-15
  Filled 2013-02-14: qty 1

## 2013-02-14 MED ORDER — INDIGOTINDISULFONATE SODIUM 8 MG/ML IJ SOLN
INTRAMUSCULAR | Status: DC | PRN
  Administered 2013-02-14: 5 mL via INTRAVENOUS

## 2013-02-14 MED ORDER — HYDROCODONE-ACETAMINOPHEN 5-325 MG PO TABS: 1.0000 | ORAL_TABLET | Freq: Four times a day (QID) | ORAL | Status: AC | PRN

## 2013-02-14 MED ORDER — FENTANYL CITRATE 0.05 MG/ML IJ SOLN
INTRAMUSCULAR | Status: DC | PRN
  Administered 2013-02-14 (×2): 50 ug via INTRAVENOUS
  Administered 2013-02-14: 100 ug via INTRAVENOUS

## 2013-02-14 MED ORDER — HYDROMORPHONE HCL PF 1 MG/ML IJ SOLN
0.2500 mg | INTRAMUSCULAR | Status: DC | PRN
  Administered 2013-02-14 (×2): 0.5 mg via INTRAVENOUS

## 2013-02-14 MED ORDER — HYDROMORPHONE HCL PF 1 MG/ML IJ SOLN
INTRAMUSCULAR | Status: AC
  Filled 2013-02-14: qty 1

## 2013-02-14 MED ORDER — SODIUM CHLORIDE 0.9 % IV SOLN
3.0000 g | INTRAVENOUS | Status: DC | PRN
  Administered 2013-02-14: 1.5 g via INTRAVENOUS

## 2013-02-14 MED ORDER — ACETAMINOPHEN 10 MG/ML IV SOLN
INTRAVENOUS | Status: DC | PRN
  Administered 2013-02-14: 1000 mg via INTRAVENOUS

## 2013-02-14 MED ORDER — AMITRIPTYLINE HCL 10 MG PO TABS
10.0000 mg | ORAL_TABLET | Freq: Every day | ORAL | Status: DC
  Administered 2013-02-14: 10 mg via ORAL
  Filled 2013-02-14 (×2): qty 1

## 2013-02-14 MED ORDER — ESTRADIOL 0.1 MG/GM VA CREA
TOPICAL_CREAM | VAGINAL | Status: AC
  Filled 2013-02-14: qty 85

## 2013-02-14 MED ORDER — FLUCONAZOLE IN SODIUM CHLORIDE 200-0.9 MG/100ML-% IV SOLN
INTRAVENOUS | Status: DC | PRN
  Administered 2013-02-14: 100 mg via INTRAVENOUS

## 2013-02-14 SURGICAL SUPPLY — 59 items
BAG URINE DRAINAGE (UROLOGICAL SUPPLIES) ×2 IMPLANT
BAG URO CATCHER STRL LF (DRAPE) ×1 IMPLANT
BLADE HEX COATED 2.75 (ELECTRODE) IMPLANT
BLADE SURG 15 STRL LF DISP TIS (BLADE) ×2 IMPLANT
BLADE SURG 15 STRL SS (BLADE) ×4
CANISTER SUCTION 2500CC (MISCELLANEOUS) ×2 IMPLANT
CATH FOLEY 2WAY SLVR  5CC 14FR (CATHETERS) ×1
CATH FOLEY 2WAY SLVR  5CC 16FR (CATHETERS)
CATH FOLEY 2WAY SLVR 5CC 14FR (CATHETERS) ×1 IMPLANT
CATH FOLEY 2WAY SLVR 5CC 16FR (CATHETERS) IMPLANT
CLOTH BEACON ORANGE TIMEOUT ST (SAFETY) ×2 IMPLANT
COVER MAYO STAND STRL (DRAPES) IMPLANT
COVER SURGICAL LIGHT HANDLE (MISCELLANEOUS) ×2 IMPLANT
DEVICE CAPIO SUTURING (INSTRUMENTS) ×1
DEVICE CAPIO SUTURING OPC (INSTRUMENTS) ×1 IMPLANT
DRAIN PENROSE 18X1/4 LTX STRL (WOUND CARE) ×2 IMPLANT
DRAPE CAMERA CLOSED 9X96 (DRAPES) ×1 IMPLANT
GAUZE PACKING 2X5 YD STERILE (GAUZE/BANDAGES/DRESSINGS) ×3 IMPLANT
GAUZE SPONGE 4X4 16PLY XRAY LF (GAUZE/BANDAGES/DRESSINGS) ×4 IMPLANT
GLOVE BIOGEL M 6.5 STRL (GLOVE) ×2 IMPLANT
GLOVE BIOGEL M STRL SZ7.5 (GLOVE) ×11 IMPLANT
GOWN PREVENTION PLUS XLARGE (GOWN DISPOSABLE) ×1 IMPLANT
GOWN STRL REIN XL XLG (GOWN DISPOSABLE) ×4 IMPLANT
HOLDER FOLEY CATH W/STRAP (MISCELLANEOUS) ×2 IMPLANT
IV NS 1000ML (IV SOLUTION)
IV NS 1000ML BAXH (IV SOLUTION) ×1 IMPLANT
KIT BASIN OR (CUSTOM PROCEDURE TRAY) ×2 IMPLANT
NDL MAYO 6 CRC TAPER PT (NEEDLE) ×1 IMPLANT
NEEDLE HYPO 22GX1.5 SAFETY (NEEDLE) ×1 IMPLANT
NEEDLE INJECT RIGID (NEEDLE) IMPLANT
NEEDLE INJECT RIGID 21GA 14.6 (NEEDLE) IMPLANT
NEEDLE MAYO 6 CRC TAPER PT (NEEDLE) ×2 IMPLANT
NS IRRIG 1000ML POUR BTL (IV SOLUTION) ×2 IMPLANT
PACK CYSTO (CUSTOM PROCEDURE TRAY) ×2 IMPLANT
PENCIL BUTTON HOLSTER BLD 10FT (ELECTRODE) ×2 IMPLANT
PLUG CATH AND CAP STER (CATHETERS) ×3 IMPLANT
RETRACTOR STAY HOOK 5MM (MISCELLANEOUS) ×2 IMPLANT
SHEET LAVH (DRAPES) ×2 IMPLANT
SUT CAPIO ETHIBPND (SUTURE) ×6 IMPLANT
SUT SILK 2 0 30  PSL (SUTURE)
SUT SILK 2 0 30 PSL (SUTURE) ×1 IMPLANT
SUT VIC AB 0 CT1 27 (SUTURE)
SUT VIC AB 0 CT1 27XBRD ANTBC (SUTURE) ×2 IMPLANT
SUT VIC AB 2-0 CT1 27 (SUTURE) ×2
SUT VIC AB 2-0 CT1 27XBRD (SUTURE) ×1 IMPLANT
SUT VIC AB 2-0 SH 27 (SUTURE) ×6
SUT VIC AB 2-0 SH 27X BRD (SUTURE) ×6 IMPLANT
SUT VIC AB 3-0 PS2 18 (SUTURE)
SUT VIC AB 3-0 PS2 18XBRD (SUTURE) IMPLANT
SUT VIC AB 3-0 SH 27 (SUTURE) ×4
SUT VIC AB 3-0 SH 27XBRD (SUTURE) ×6 IMPLANT
SUT VICRYL 0 UR6 27IN ABS (SUTURE) ×9 IMPLANT
SYRINGE 10CC LL (SYRINGE) ×2 IMPLANT
TISSUE REPAIR XENFORM 6X10CM (Tissue) ×1 IMPLANT
TOWEL OR 17X26 10 PK STRL BLUE (TOWEL DISPOSABLE) ×2 IMPLANT
TOWEL OR NON WOVEN STRL DISP B (DISPOSABLE) ×2 IMPLANT
TUBING CONNECTING 10 (TUBING) ×4 IMPLANT
WATER STERILE IRR 1500ML POUR (IV SOLUTION) ×1 IMPLANT
YANKAUER SUCT BULB TIP 10FT TU (MISCELLANEOUS) ×2 IMPLANT

## 2013-02-14 NOTE — Interval H&P Note (Signed)
History and Physical Interval Note:  02/14/2013 7:02 AM  Robin Ellis  has presented today for surgery, with the diagnosis of cystocele/rectocele/vault prolapse  The various methods of treatment have been discussed with the patient and family. After consideration of risks, benefits and other options for treatment, the patient has consented to  Procedure(s): CYSTOSCOPY (N/A) ANTERIOR (CYSTOCELE) AND POSTERIOR REPAIR (RECTOCELE) ans Valut Prolapse Repair with Graft (N/A) as a surgical intervention .  The patient's history has been reviewed, patient examined, no change in status, stable for surgery.  I have reviewed the patient's chart and labs.  Questions were answered to the patient's satisfaction.     Whitney Bingaman A

## 2013-02-14 NOTE — Progress Notes (Signed)
Foley cath plugged on arrival to PACU; Lujean Rave, PA notified and OK to connect cath to drain

## 2013-02-14 NOTE — Progress Notes (Signed)
No pain milde nuasea Labs ok See tomorrow

## 2013-02-14 NOTE — Progress Notes (Signed)
Received call regarding vaginal dressing packing and foley catheter removal.  Informed Aram Beecham, RN,  that I would defer the dressing and Foley catheter removal decision to Dr. Sherron Monday tomorrow AM during his rounding.  Received confirmation that Foley is patent w/ good output.  Patient requests for diclofenac sodium for migraine.  I've asked Aram Beecham to let patient know that she cannot take this medication for another week since it is an NSAID and may cause increased risk of bleeding postoperatively.  I prescribed Imitrex low dose BID, PRN, for her migraine instead.  Per patient has taken Imitrex before w/o adverse reactions.

## 2013-02-14 NOTE — Transfer of Care (Signed)
Immediate Anesthesia Transfer of Care Note  Patient: Robin Ellis  Procedure(s) Performed: Procedure(s): CYSTOSCOPY (N/A) ANTERIOR (CYSTOCELE) AND  Valut Prolapse Repair with Graft (N/A)  Patient Location: PACU  Anesthesia Type:General  Level of Consciousness: awake and alert   Airway & Oxygen Therapy: Patient Spontanous Breathing and Patient connected to face mask oxygen  Post-op Assessment: Report given to PACU RN and Post -op Vital signs reviewed and stable  Post vital signs: Reviewed and stable  Complications: No apparent anesthesia complications

## 2013-02-14 NOTE — Anesthesia Postprocedure Evaluation (Signed)
Anesthesia Post Note  Patient: Robin Ellis  Procedure(s) Performed: Procedure(s) (LRB): CYSTOSCOPY (N/A) ANTERIOR (CYSTOCELE) AND  Valut Prolapse Repair with Graft (N/A)  Anesthesia type: General  Patient location: PACU  Post pain: Pain level controlled  Post assessment: Post-op Vital signs reviewed  Last Vitals:  Filed Vitals:   02/14/13 1100  BP:   Pulse:   Temp: 36.3 C  Resp:     Post vital signs: Reviewed  Level of consciousness: sedated  Complications: No apparent anesthesia complications

## 2013-02-14 NOTE — Op Note (Signed)
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 Lorin Picket Karmello Abercrombie Assistant: Pecola Leisure  The patient has the above diagnoses and consented to the above procedure. Extra care was taken with leg positioning to minimize the risk of compartment syndrome and neuropathy and deep vein thrombosis. Preoperative antibiotics were given. She had a grade 3 cystocele with large central defect in the vaginal cuff was easily identified placing a 3-0 Vicryl. I was a bit surprised on the length and size of the cystocele based upon her body habitus  I instilled 25 cc of a lidocaine epinephrine mixture and did not a T-shaped anterior vaginal wall incision and sharply dissected the vaginal wall mucosa from the underlying pubocervical fascia to the white line bilaterally mobilizing well at the apex   I did a 2 layer repair not imbricating the bladder neck using 2-0 Vicryl. I was very happy with the reduction of the cystocele visually and endoscopically.   I then cystoscoped the patient taking down my double-ring retractor. Ureters were a lateral. There was no distortion. Reduction cystocele was noted. There is no bladder injury. There is excellent efflux bilaterally of blue dye  I bluntly dissected with my index finger to the initial spine bilaterally. There was thickening of tissue in that area but I was diligent to be in the correct plane and suite soft tissues medially. Utilizing a Capio device I placed a 0 Ethibond 1 fingerbreadth medial to the initial spine in a straight line between the spines. The tissue was a little bit spongy but it was minimal in the position was very good. A number of Capio devices were used and actually sent back to the   company since they were not catching tissue  I did a digital rectal examination  and again I was very pleased with the position of the Ethibond sutures. There was no injury to rectum.  I mobilized near the urethrovesical angle and placed a 0 Vicryl into the pelvic sidewall with my usual technique. I cut a 10 x 6 dermal graft well-prepared and sewed it in place with a 4 sutures. It was tension-free. I stabilized the vaginal cuff to the middle aspect of the graft cephalad.  I trimmed an appropriate amount of anterior vaginal wall mucosa and close anterior vaginal wall with running 2-0 Vicryl and CT1 needle. She had excellent vaginal length and very good support anteriorly.  I repeated the rectal examination there is no injury. She had no rectocele distally and a very mild rectocele and a high vaginal vault with good support apically.  well-prepared vaginal pack was utilized. Leg position was good. Blood loss was less than 100 mL. Patient was taken to recovery room

## 2013-02-14 NOTE — Progress Notes (Signed)
The patient MAY have had a mild yeast infection intra-op. Just in case i gave diflucan 100 mg iv.

## 2013-02-14 NOTE — Progress Notes (Signed)
Was told in report from day shift RN that plan is for pt to likely be discharged tomorrow, 5/7. Pt still has vaginal packing and foley. Consulted PA on call for Dr. Sherron Monday, Seward Grater. Nadine ordered to defer both vaginal packing and foley discontinuation orders to Dr. Sherron Monday tomorrow morning. Pt also complaining of migraine (pt has hx of migraines). Nadine ordered 25 mg of Imitrex, BID. Not to exceed 50 mg in 24 hour period. Will continue to monitor pt. - Christell Faith, RN

## 2013-02-15 ENCOUNTER — Encounter (HOSPITAL_COMMUNITY): Payer: Self-pay | Admitting: Urology

## 2013-02-15 LAB — BASIC METABOLIC PANEL
BUN: 8 mg/dL (ref 6–23)
CO2: 23 mEq/L (ref 19–32)
Calcium: 7.7 mg/dL — ABNORMAL LOW (ref 8.4–10.5)
Chloride: 100 mEq/L (ref 96–112)
Creatinine, Ser: 0.84 mg/dL (ref 0.50–1.10)
GFR calc Af Amer: 78 mL/min — ABNORMAL LOW (ref >90)
GFR calc non Af Amer: 67 mL/min — ABNORMAL LOW (ref >90)
Glucose, Bld: 112 mg/dL — ABNORMAL HIGH (ref 70–99)
Potassium: 3.7 mEq/L (ref 3.5–5.1)
Sodium: 131 mEq/L — ABNORMAL LOW (ref 135–145)

## 2013-02-15 LAB — HEMOGLOBIN AND HEMATOCRIT, BLOOD
HCT: 35.1 % — ABNORMAL LOW (ref 36.0–46.0)
Hemoglobin: 11.3 g/dL — ABNORMAL LOW (ref 12.0–15.0)

## 2013-02-15 MED ORDER — ACETAMINOPHEN 500 MG PO TABS
1000.0000 mg | ORAL_TABLET | Freq: Once | ORAL | Status: AC
  Administered 2013-02-15: 1000 mg via ORAL
  Filled 2013-02-15: qty 2

## 2013-02-15 NOTE — Progress Notes (Signed)
Clarified with Dr. Sherron Monday that pt was ok to be discharged after second void was 200 ml and PVR was 300 ml. Gave prescriptions to pt. Reviewed discharge packet with pt and family. No further questions after discharge education. IV was removed earlier in shift.

## 2013-02-15 NOTE — Discharge Summary (Signed)
Physician Discharge Summary  Patient ID: Robin Ellis MRN: [id number] DOB/AGE: 10/31/1940 72 y.o.  Admit date: 02/14/2013 Discharge date: 02/15/2013  Admission Diagnoses: Cystocele  Discharge Diagnoses: Cystocele and vault prolapse Active Problems:   * No active hospital problems. *   Discharged Condition: good  Hospital Course: Surgery went well. Mild nausea and migraine post op. Post op labs normal.   Consults: None  Significant Diagnostic Studies: microbiology: none  Treatments: IV hydration  Discharge Exam: Blood pressure 113/66, pulse 78, temperature 98.1 F (36.7 C), temperature source Oral, resp. rate 18, height 5\' 2"  (1.575 m), weight 58.559 kg (129 lb 1.6 oz), SpO2 100.00%. Benign belly  Disposition: 01-Home or Self Care     Medication List    TAKE these medications       acyclovir ointment 5 %  Commonly known as:  ZOVIRAX  1/2 inch ribbon of ointment every 3 hours (6 times/day) for 7 days     ALIGN PO  Take 1 tablet by mouth daily.     amitriptyline 10 MG tablet  Commonly known as:  ELAVIL  Take 1 tablet (10 mg total) by mouth at bedtime.     Calcium 1200 1200-1000 MG-UNIT Chew  Chew 1 tablet by mouth 2 (two) times daily.     cholecalciferol 1000 UNITS tablet  Commonly known as:  VITAMIN D  Take 1,000 Units by mouth daily.     ciprofloxacin 250 MG tablet  Commonly known as:  CIPRO  Take 1 tablet (250 mg total) by mouth 2 (two) times daily.     [facility] MIGRAINE RELIEF 250-250-65 MG per tablet  Generic drug:  aspirin-acetaminophen-caffeine  Take 0.5-1 tablets by mouth every 6 (six) hours as needed for pain (for headache).     estradiol 0.5 MG tablet  Commonly known as:  ESTRACE  TAKE 1 TABLET BY MOUTH EVERY DAY     estradiol 0.5 MG tablet  Commonly known as:  ESTRACE  TAKE 1 TABLET BY MOUTH EVERY DAY     estradiol 0.5 MG tablet  Commonly known as:  ESTRACE  Take 0.5 mg by mouth daily.     HYDROcodone-acetaminophen 5-325 MG per tablet   Commonly known as:  NORCO  Take 1-2 tablets by mouth every 6 (six) hours as needed for pain.     hydrocortisone 2.5 % rectal cream  Commonly known as:  ANUSOL-HC  Place rectally 2 (two) times daily as needed for hemorrhoids. May apply with finger     hyoscyamine 0.125 MG SL tablet  Commonly known as:  LEVSIN SL  Place 0.125 mg under the tongue every 6 (six) hours as needed for cramping.     mometasone 50 MCG/ACT nasal spray  Commonly known as:  NASONEX  Place 2 sprays into the nose daily as needed (allergies).     pantoprazole 40 MG tablet  Commonly known as:  PROTONIX  Take 1 tablet (40 mg total) by mouth daily.     simvastatin 10 MG tablet  Commonly known as:  ZOCOR  Take 10 mg by mouth at bedtime.     SYSTANE ULTRA 0.4-0.3 % Soln  Generic drug:  Polyethyl Glycol-Propyl Glycol  Place 1 drop into both eyes at bedtime as needed (for dry eyes).     vitamin C 500 MG tablet  Commonly known as:  ASCORBIC ACID  Take 500 mg by mouth daily.     zonisamide 100 MG capsule  Commonly known as:  ZONEGRAN  Take 200 mg by mouth  daily.           Follow-up Information   Follow up with Cordarrel Stiefel A, MD. (as scheduled)    Contact information:   [address] [facility] [place] [address] [phone]       Signed: Arrion Broaddus A 02/15/2013, 8:21 AM

## 2013-02-15 NOTE — Care Management Note (Signed)
    Page 1 of 1   02/15/2013     4:53:59 PM   CARE MANAGEMENT NOTE 02/15/2013  Patient:  Robin Ellis, Robin Ellis   Account Number:  [id number]  Date Initiated:  02/15/2013  Documentation initiated by:  Lanier Clam  Subjective/Objective Assessment:   ADMITTED W/STRESS INCONTINENCE     Action/Plan:   FROM HOME.HAS PCP,PHARMACY.   Anticipated DC Date:  02/15/2013   Anticipated DC Plan:  HOME/SELF CARE      DC Planning Services  CM consult      Choice offered to / List presented to:             Status of service:  Completed, signed off Medicare Important Message given?   (If response is "NO", the following Medicare IM given date fields will be blank) Date Medicare IM given:   Date Additional Medicare IM given:    Discharge Disposition:  HOME/SELF CARE  Per UR Regulation:  Reviewed for med. necessity/level of care/duration of stay  If discussed at Long Length of Stay Meetings, dates discussed:    Comments:  02/15/13 Jeffory Snelgrove RN,BSN NCM [phone] S/P CYSTOCELE.NO D/C NEEDS.

## 2013-02-15 NOTE — Progress Notes (Signed)
Looks great  D/c home today Do and donts described

## 2013-02-15 NOTE — Progress Notes (Signed)
Pt voided 400 ml post foley removal that was done at 0850. Discussed with Dr. Sherron Monday pt's PVR of 500 ml post void. Continuing to monitor pt and per verbal order will measure next void and PVR. Will report those findings as well.

## 2013-02-15 NOTE — Progress Notes (Signed)
Pt had second void post foley removal. Total of 200 ml. When pt was bladder scanned she had around 300 ml for her PVR. Called Dr. Sherron Monday and reported these facts off on a message. Will continue to monitor pt and encourage ambulation.

## 2013-04-26 ENCOUNTER — Other Ambulatory Visit: Payer: Self-pay

## 2013-04-26 MED ORDER — SIMVASTATIN 10 MG PO TABS: 10.0000 mg | ORAL_TABLET | Freq: Every day | ORAL | Status: DC

## 2013-04-26 NOTE — Telephone Encounter (Signed)
Pt has recently moved to [place], [place] and request refill simvastatin to [facility]. Advised done and prior to finishing these refills pt will reestablish with local doctor.

## 2013-05-18 ENCOUNTER — Other Ambulatory Visit: Payer: Self-pay

## 2013-05-18 MED ORDER — PANTOPRAZOLE SODIUM 40 MG PO TBEC: 40.0000 mg | DELAYED_RELEASE_TABLET | Freq: Every day | ORAL | Status: DC

## 2013-08-17 ENCOUNTER — Other Ambulatory Visit: Payer: Self-pay

## 2013-09-27 ENCOUNTER — Other Ambulatory Visit: Payer: Self-pay | Admitting: Family Medicine

## 2013-09-27 MED ORDER — SIMVASTATIN 10 MG PO TABS: ORAL_TABLET | ORAL | Status: AC

## 2013-09-27 NOTE — Telephone Encounter (Signed)
Which medication is she requesting?

## 2013-09-27 NOTE — Telephone Encounter (Signed)
Pt requesting medication refill. Last OV 11/25/2012 with no scheduled future appts. pls advise

## 2013-09-27 NOTE — Telephone Encounter (Signed)
Spoke to R. Baity, NP. Rx refilled. Pt will need OV for additional refills. Attempted to contact pt, unable to leave message

## 2013-09-27 NOTE — Addendum Note (Signed)
Addended by: Desmond Dike on: 09/27/2013 01:59 PM   Modules accepted: Orders

## 2013-10-07 ENCOUNTER — Other Ambulatory Visit: Payer: Self-pay | Admitting: Internal Medicine

## 2013-12-25 ENCOUNTER — Other Ambulatory Visit: Payer: Self-pay | Admitting: Internal Medicine

## 2014-01-03 ENCOUNTER — Other Ambulatory Visit: Payer: Self-pay | Admitting: Internal Medicine

## 2014-01-03 NOTE — Telephone Encounter (Signed)
Seen in January, please advise how many refills if ok Sir.  Thank you.

## 2014-01-03 NOTE — Telephone Encounter (Signed)
10 total fills ok

## 2014-01-10 ENCOUNTER — Other Ambulatory Visit: Payer: Self-pay | Admitting: Family Medicine

## 2014-02-08 ENCOUNTER — Other Ambulatory Visit: Payer: Self-pay | Admitting: Family Medicine

## 2014-07-16 ENCOUNTER — Other Ambulatory Visit: Payer: Self-pay

## 2014-07-16 MED ORDER — PANTOPRAZOLE SODIUM 40 MG PO TBEC: 40.0000 mg | DELAYED_RELEASE_TABLET | Freq: Every day | ORAL | Status: AC

## 2014-10-05 ENCOUNTER — Other Ambulatory Visit: Payer: Self-pay | Admitting: Internal Medicine

## 2014-10-08 NOTE — Telephone Encounter (Signed)
May refill x 1 [place] [facility] so I think she is going to need to continue care and Rx down there vs. Scheduling with me

## 2014-10-08 NOTE — Telephone Encounter (Signed)
May I refill Sir? 

## 2014-10-08 NOTE — Telephone Encounter (Signed)
Left voice mail informing patient what Dr Stan Headarl Gessner advised and told her refill sent in.

## 2014-11-05 ENCOUNTER — Other Ambulatory Visit: Payer: Self-pay | Admitting: Internal Medicine

## 2014-11-05 NOTE — Telephone Encounter (Signed)
May refill x 2 She needs a visit with PCP or me to get more

## 2014-11-05 NOTE — Telephone Encounter (Signed)
May i refill Sir?

## 2014-12-31 ENCOUNTER — Other Ambulatory Visit: Payer: Self-pay | Admitting: Internal Medicine

## 2014-12-31 NOTE — Telephone Encounter (Signed)
Ok to refill x 1-2 but needs to see me for more refills

## 2014-12-31 NOTE — Telephone Encounter (Signed)
May I refill Sir? 

## 2015-02-11 ENCOUNTER — Other Ambulatory Visit: Payer: Self-pay | Admitting: Internal Medicine

## 2015-02-12 NOTE — Telephone Encounter (Signed)
She needs to get this locally [facility] [facility] we could do is 1 more month but she needs to switch over to local MD

## 2015-02-12 NOTE — Telephone Encounter (Signed)
May I refill Sir? 

## 2015-02-12 NOTE — Telephone Encounter (Signed)
Left message for patient to call me back. 

## 2015-02-18 NOTE — Telephone Encounter (Signed)
Patient called back and left message on Robin AbrahamsPatty Ellis, CMA voice mail saying she had notified the pharmacy that this was not to go to Robin Ellis.

## 2015-02-18 NOTE — Telephone Encounter (Signed)
Left message on her cell # to call me back.

## 2015-02-25 ENCOUNTER — Encounter: Payer: Self-pay | Admitting: Internal Medicine

## 2015-03-03 ENCOUNTER — Other Ambulatory Visit: Payer: Self-pay | Admitting: Internal Medicine

## 2015-03-04 NOTE — Telephone Encounter (Signed)
Ok to refill Sir? 

## 2015-03-05 NOTE — Telephone Encounter (Signed)
[place] ref[place] [facility] there

## 2015-04-08 ENCOUNTER — Other Ambulatory Visit: Payer: Self-pay

## 2021-06-12 DEATH — deceased
